# Patient Record
Sex: Male | Born: 1945 | Race: White | Hispanic: No | Marital: Married | Smoking: Former smoker
Health system: Southern US, Community
[De-identification: ages and names within clinical notes are randomized; demographics above are authoritative.]

## PROBLEM LIST (undated history)

## (undated) DIAGNOSIS — E785 Hyperlipidemia, unspecified: Secondary | ICD-10-CM

## (undated) DIAGNOSIS — R451 Restlessness and agitation: Secondary | ICD-10-CM

## (undated) DIAGNOSIS — I639 Cerebral infarction, unspecified: Secondary | ICD-10-CM

## (undated) DIAGNOSIS — C4359 Malignant melanoma of other part of trunk: Secondary | ICD-10-CM

## (undated) DIAGNOSIS — E46 Unspecified protein-calorie malnutrition: Secondary | ICD-10-CM

## (undated) HISTORY — DX: Malignant melanoma of other part of trunk: C43.59

## (undated) HISTORY — DX: Hyperlipidemia, unspecified: E78.5

## (undated) HISTORY — PX: CORNEAL TRANSPLANT: SHX108

## (undated) HISTORY — DX: Cerebral infarction, unspecified: I63.9

## (undated) HISTORY — PX: BACK SURGERY: SHX140

## (undated) HISTORY — DX: Unspecified protein-calorie malnutrition: E46

## (undated) HISTORY — PX: MELANOMA EXCISION: SHX5266

## (undated) HISTORY — DX: Restlessness and agitation: R45.1

---

## 1986-04-30 DIAGNOSIS — C4359 Malignant melanoma of other part of trunk: Secondary | ICD-10-CM

## 1986-04-30 HISTORY — DX: Malignant melanoma of other part of trunk: C43.59

## 2005-01-31 ENCOUNTER — Ambulatory Visit: Payer: Self-pay | Admitting: Physician Assistant

## 2005-02-08 ENCOUNTER — Encounter: Payer: Self-pay | Admitting: Unknown Physician Specialty

## 2005-05-03 ENCOUNTER — Inpatient Hospital Stay: Payer: Self-pay | Admitting: Internal Medicine

## 2005-05-03 ENCOUNTER — Other Ambulatory Visit: Payer: Self-pay

## 2005-05-25 ENCOUNTER — Ambulatory Visit: Payer: Self-pay | Admitting: Family Medicine

## 2005-05-31 ENCOUNTER — Ambulatory Visit: Payer: Self-pay | Admitting: Internal Medicine

## 2005-10-22 ENCOUNTER — Ambulatory Visit: Payer: Self-pay | Admitting: Gastroenterology

## 2005-10-30 ENCOUNTER — Ambulatory Visit: Payer: Self-pay | Admitting: Unknown Physician Specialty

## 2006-05-01 ENCOUNTER — Ambulatory Visit: Payer: Self-pay | Admitting: Internal Medicine

## 2007-01-02 ENCOUNTER — Ambulatory Visit: Payer: Self-pay | Admitting: Internal Medicine

## 2007-01-09 ENCOUNTER — Ambulatory Visit: Payer: Self-pay | Admitting: Internal Medicine

## 2007-04-10 ENCOUNTER — Ambulatory Visit: Payer: Self-pay | Admitting: Internal Medicine

## 2007-07-02 ENCOUNTER — Ambulatory Visit: Payer: Self-pay | Admitting: Internal Medicine

## 2007-07-07 ENCOUNTER — Ambulatory Visit: Payer: Self-pay | Admitting: Internal Medicine

## 2007-07-14 ENCOUNTER — Ambulatory Visit: Payer: Self-pay | Admitting: Internal Medicine

## 2007-09-11 ENCOUNTER — Encounter: Payer: Self-pay | Admitting: Neurosurgery

## 2007-09-29 ENCOUNTER — Encounter: Payer: Self-pay | Admitting: Neurosurgery

## 2009-06-12 ENCOUNTER — Emergency Department: Payer: Self-pay | Admitting: Emergency Medicine

## 2009-06-19 ENCOUNTER — Ambulatory Visit: Payer: Self-pay | Admitting: Internal Medicine

## 2011-01-09 ENCOUNTER — Ambulatory Visit: Payer: Self-pay | Admitting: Gastroenterology

## 2011-01-11 LAB — PATHOLOGY REPORT

## 2011-04-17 HISTORY — PX: OTHER SURGICAL HISTORY: SHX169

## 2011-06-18 ENCOUNTER — Emergency Department: Payer: Self-pay | Admitting: Emergency Medicine

## 2011-06-18 DIAGNOSIS — R5381 Other malaise: Secondary | ICD-10-CM | POA: Diagnosis not present

## 2011-06-18 DIAGNOSIS — F0280 Dementia in other diseases classified elsewhere without behavioral disturbance: Secondary | ICD-10-CM | POA: Diagnosis not present

## 2011-06-18 DIAGNOSIS — R29818 Other symptoms and signs involving the nervous system: Secondary | ICD-10-CM | POA: Diagnosis not present

## 2011-06-18 DIAGNOSIS — R4182 Altered mental status, unspecified: Secondary | ICD-10-CM | POA: Diagnosis not present

## 2011-06-18 DIAGNOSIS — R5383 Other fatigue: Secondary | ICD-10-CM | POA: Diagnosis not present

## 2011-06-18 DIAGNOSIS — Z79899 Other long term (current) drug therapy: Secondary | ICD-10-CM | POA: Diagnosis not present

## 2011-06-18 DIAGNOSIS — E785 Hyperlipidemia, unspecified: Secondary | ICD-10-CM | POA: Diagnosis not present

## 2011-06-18 DIAGNOSIS — R4789 Other speech disturbances: Secondary | ICD-10-CM | POA: Diagnosis not present

## 2011-06-18 LAB — CBC
HCT: 42.9 % (ref 40.0–52.0)
HGB: 14.6 g/dL (ref 13.0–18.0)
MCH: 30 pg (ref 26.0–34.0)
MCHC: 34 g/dL (ref 32.0–36.0)
MCV: 88 fL (ref 80–100)
RDW: 12.7 % (ref 11.5–14.5)
WBC: 7.7 10*3/uL (ref 3.8–10.6)

## 2011-06-18 LAB — URINALYSIS, COMPLETE
Bacteria: NONE SEEN
Blood: NEGATIVE
Glucose,UR: NEGATIVE mg/dL (ref 0–75)
Ketone: NEGATIVE
Leukocyte Esterase: NEGATIVE
Nitrite: NEGATIVE
Protein: NEGATIVE
RBC,UR: 1 /HPF (ref 0–5)
Specific Gravity: 1.021 (ref 1.003–1.030)
Squamous Epithelial: NONE SEEN
WBC UR: <1 /HPF (ref 0–5)

## 2011-06-18 LAB — COMPREHENSIVE METABOLIC PANEL
Albumin: 3.6 g/dL (ref 3.4–5.0)
Anion Gap: 8 (ref 7–16)
BUN: 17 mg/dL (ref 7–18)
Bilirubin,Total: 0.5 mg/dL (ref 0.2–1.0)
Chloride: 105 mmol/L (ref 98–107)
Co2: 28 mmol/L (ref 21–32)
Creatinine: 1.08 mg/dL (ref 0.60–1.30)
EGFR (Non-African Amer.): >60
Glucose: 122 mg/dL — ABNORMAL HIGH (ref 65–99)
Osmolality: 284 (ref 275–301)
Potassium: 4.3 mmol/L (ref 3.5–5.1)
Sodium: 141 mmol/L (ref 136–145)
Total Protein: 7.7 g/dL (ref 6.4–8.2)

## 2011-06-19 DIAGNOSIS — R269 Unspecified abnormalities of gait and mobility: Secondary | ICD-10-CM | POA: Diagnosis not present

## 2011-06-19 DIAGNOSIS — G3109 Other frontotemporal dementia: Secondary | ICD-10-CM | POA: Diagnosis not present

## 2011-06-19 DIAGNOSIS — F0281 Dementia in other diseases classified elsewhere with behavioral disturbance: Secondary | ICD-10-CM | POA: Diagnosis not present

## 2011-06-21 DIAGNOSIS — R269 Unspecified abnormalities of gait and mobility: Secondary | ICD-10-CM | POA: Diagnosis not present

## 2011-06-21 DIAGNOSIS — F0281 Dementia in other diseases classified elsewhere with behavioral disturbance: Secondary | ICD-10-CM | POA: Diagnosis not present

## 2011-06-21 DIAGNOSIS — G3109 Other frontotemporal dementia: Secondary | ICD-10-CM | POA: Diagnosis not present

## 2011-06-22 DIAGNOSIS — F0280 Dementia in other diseases classified elsewhere without behavioral disturbance: Secondary | ICD-10-CM | POA: Diagnosis not present

## 2011-06-22 DIAGNOSIS — R269 Unspecified abnormalities of gait and mobility: Secondary | ICD-10-CM | POA: Diagnosis not present

## 2011-06-22 DIAGNOSIS — F0281 Dementia in other diseases classified elsewhere with behavioral disturbance: Secondary | ICD-10-CM | POA: Diagnosis not present

## 2011-06-22 DIAGNOSIS — G3109 Other frontotemporal dementia: Secondary | ICD-10-CM | POA: Diagnosis not present

## 2011-06-23 DIAGNOSIS — F0280 Dementia in other diseases classified elsewhere without behavioral disturbance: Secondary | ICD-10-CM | POA: Diagnosis not present

## 2011-06-23 DIAGNOSIS — F0281 Dementia in other diseases classified elsewhere with behavioral disturbance: Secondary | ICD-10-CM | POA: Diagnosis not present

## 2011-06-23 DIAGNOSIS — R269 Unspecified abnormalities of gait and mobility: Secondary | ICD-10-CM | POA: Diagnosis not present

## 2011-06-26 DIAGNOSIS — Z87820 Personal history of traumatic brain injury: Secondary | ICD-10-CM | POA: Diagnosis not present

## 2011-06-26 DIAGNOSIS — F0281 Dementia in other diseases classified elsewhere with behavioral disturbance: Secondary | ICD-10-CM | POA: Diagnosis not present

## 2011-06-26 DIAGNOSIS — G911 Obstructive hydrocephalus: Secondary | ICD-10-CM | POA: Diagnosis not present

## 2011-06-26 DIAGNOSIS — R453 Demoralization and apathy: Secondary | ICD-10-CM | POA: Diagnosis not present

## 2011-06-26 DIAGNOSIS — F09 Unspecified mental disorder due to known physiological condition: Secondary | ICD-10-CM | POA: Diagnosis not present

## 2011-06-26 DIAGNOSIS — F0391 Unspecified dementia with behavioral disturbance: Secondary | ICD-10-CM | POA: Diagnosis not present

## 2011-06-26 DIAGNOSIS — IMO0002 Reserved for concepts with insufficient information to code with codable children: Secondary | ICD-10-CM | POA: Diagnosis not present

## 2011-06-26 DIAGNOSIS — G3109 Other frontotemporal dementia: Secondary | ICD-10-CM | POA: Diagnosis not present

## 2011-06-26 DIAGNOSIS — R269 Unspecified abnormalities of gait and mobility: Secondary | ICD-10-CM | POA: Diagnosis not present

## 2011-06-28 DIAGNOSIS — F0280 Dementia in other diseases classified elsewhere without behavioral disturbance: Secondary | ICD-10-CM | POA: Diagnosis not present

## 2011-06-29 DIAGNOSIS — F0280 Dementia in other diseases classified elsewhere without behavioral disturbance: Secondary | ICD-10-CM | POA: Diagnosis not present

## 2011-06-29 DIAGNOSIS — F0281 Dementia in other diseases classified elsewhere with behavioral disturbance: Secondary | ICD-10-CM | POA: Diagnosis not present

## 2011-06-29 DIAGNOSIS — R269 Unspecified abnormalities of gait and mobility: Secondary | ICD-10-CM | POA: Diagnosis not present

## 2011-07-01 DIAGNOSIS — G3109 Other frontotemporal dementia: Secondary | ICD-10-CM | POA: Diagnosis not present

## 2011-07-01 DIAGNOSIS — R269 Unspecified abnormalities of gait and mobility: Secondary | ICD-10-CM | POA: Diagnosis not present

## 2011-07-01 DIAGNOSIS — F0281 Dementia in other diseases classified elsewhere with behavioral disturbance: Secondary | ICD-10-CM | POA: Diagnosis not present

## 2011-07-02 DIAGNOSIS — F0281 Dementia in other diseases classified elsewhere with behavioral disturbance: Secondary | ICD-10-CM | POA: Diagnosis not present

## 2011-07-02 DIAGNOSIS — G3109 Other frontotemporal dementia: Secondary | ICD-10-CM | POA: Diagnosis not present

## 2011-07-02 DIAGNOSIS — R269 Unspecified abnormalities of gait and mobility: Secondary | ICD-10-CM | POA: Diagnosis not present

## 2011-07-03 DIAGNOSIS — G3109 Other frontotemporal dementia: Secondary | ICD-10-CM | POA: Diagnosis not present

## 2011-07-03 DIAGNOSIS — F0281 Dementia in other diseases classified elsewhere with behavioral disturbance: Secondary | ICD-10-CM | POA: Diagnosis not present

## 2011-07-03 DIAGNOSIS — R269 Unspecified abnormalities of gait and mobility: Secondary | ICD-10-CM | POA: Diagnosis not present

## 2011-07-04 DIAGNOSIS — F0280 Dementia in other diseases classified elsewhere without behavioral disturbance: Secondary | ICD-10-CM | POA: Diagnosis not present

## 2011-07-04 DIAGNOSIS — F0281 Dementia in other diseases classified elsewhere with behavioral disturbance: Secondary | ICD-10-CM | POA: Diagnosis not present

## 2011-07-04 DIAGNOSIS — R269 Unspecified abnormalities of gait and mobility: Secondary | ICD-10-CM | POA: Diagnosis not present

## 2011-07-05 ENCOUNTER — Emergency Department: Payer: Self-pay | Admitting: Internal Medicine

## 2011-07-05 DIAGNOSIS — R0602 Shortness of breath: Secondary | ICD-10-CM | POA: Diagnosis not present

## 2011-07-05 DIAGNOSIS — G911 Obstructive hydrocephalus: Secondary | ICD-10-CM | POA: Diagnosis not present

## 2011-07-05 DIAGNOSIS — E7800 Pure hypercholesterolemia, unspecified: Secondary | ICD-10-CM | POA: Diagnosis not present

## 2011-07-05 DIAGNOSIS — Z79899 Other long term (current) drug therapy: Secondary | ICD-10-CM | POA: Diagnosis not present

## 2011-07-05 DIAGNOSIS — R5383 Other fatigue: Secondary | ICD-10-CM | POA: Diagnosis not present

## 2011-07-05 DIAGNOSIS — R5381 Other malaise: Secondary | ICD-10-CM | POA: Diagnosis not present

## 2011-07-05 DIAGNOSIS — F29 Unspecified psychosis not due to a substance or known physiological condition: Secondary | ICD-10-CM | POA: Diagnosis not present

## 2011-07-05 DIAGNOSIS — F0390 Unspecified dementia without behavioral disturbance: Secondary | ICD-10-CM | POA: Diagnosis not present

## 2011-07-05 DIAGNOSIS — F0280 Dementia in other diseases classified elsewhere without behavioral disturbance: Secondary | ICD-10-CM | POA: Diagnosis not present

## 2011-07-05 LAB — CBC
HCT: 45 % (ref 40.0–52.0)
MCV: 88 fL (ref 80–100)
RBC: 5.12 10*6/uL (ref 4.40–5.90)
RDW: 12.1 % (ref 11.5–14.5)
WBC: 7.3 10*3/uL (ref 3.8–10.6)

## 2011-07-05 LAB — COMPREHENSIVE METABOLIC PANEL
Albumin: 3.8 g/dL (ref 3.4–5.0)
Anion Gap: 11 (ref 7–16)
BUN: 22 mg/dL — ABNORMAL HIGH (ref 7–18)
Chloride: 106 mmol/L (ref 98–107)
Creatinine: 1.31 mg/dL — ABNORMAL HIGH (ref 0.60–1.30)
EGFR (African American): >60
EGFR (Non-African Amer.): 58 — ABNORMAL LOW
Glucose: 83 mg/dL (ref 65–99)
Osmolality: 289 (ref 275–301)
SGOT(AST): 36 U/L (ref 15–37)
SGPT (ALT): 28 U/L
Sodium: 144 mmol/L (ref 136–145)

## 2011-07-05 LAB — CK TOTAL AND CKMB (NOT AT ARMC)
CK, Total: 121 U/L (ref 35–232)
CK-MB: 1.3 ng/mL (ref 0.5–3.6)

## 2011-07-05 LAB — PROTIME-INR: Prothrombin Time: 13.6 secs (ref 11.5–14.7)

## 2011-07-06 DIAGNOSIS — Z79899 Other long term (current) drug therapy: Secondary | ICD-10-CM | POA: Diagnosis not present

## 2011-07-06 DIAGNOSIS — R269 Unspecified abnormalities of gait and mobility: Secondary | ICD-10-CM | POA: Diagnosis not present

## 2011-07-06 DIAGNOSIS — M25519 Pain in unspecified shoulder: Secondary | ICD-10-CM | POA: Diagnosis not present

## 2011-07-06 DIAGNOSIS — Z9119 Patient's noncompliance with other medical treatment and regimen: Secondary | ICD-10-CM | POA: Diagnosis not present

## 2011-07-06 DIAGNOSIS — R404 Transient alteration of awareness: Secondary | ICD-10-CM | POA: Diagnosis not present

## 2011-07-06 DIAGNOSIS — Z5189 Encounter for other specified aftercare: Secondary | ICD-10-CM | POA: Diagnosis not present

## 2011-07-06 DIAGNOSIS — IMO0002 Reserved for concepts with insufficient information to code with codable children: Secondary | ICD-10-CM | POA: Diagnosis present

## 2011-07-06 DIAGNOSIS — K5900 Constipation, unspecified: Secondary | ICD-10-CM | POA: Diagnosis not present

## 2011-07-06 DIAGNOSIS — F411 Generalized anxiety disorder: Secondary | ICD-10-CM | POA: Diagnosis present

## 2011-07-06 DIAGNOSIS — F302 Manic episode, severe with psychotic symptoms: Secondary | ICD-10-CM | POA: Diagnosis present

## 2011-07-06 DIAGNOSIS — Z885 Allergy status to narcotic agent status: Secondary | ICD-10-CM | POA: Diagnosis not present

## 2011-07-06 DIAGNOSIS — M6281 Muscle weakness (generalized): Secondary | ICD-10-CM | POA: Diagnosis not present

## 2011-07-06 DIAGNOSIS — M25559 Pain in unspecified hip: Secondary | ICD-10-CM | POA: Diagnosis not present

## 2011-07-06 DIAGNOSIS — F29 Unspecified psychosis not due to a substance or known physiological condition: Secondary | ICD-10-CM | POA: Diagnosis not present

## 2011-07-06 DIAGNOSIS — G911 Obstructive hydrocephalus: Secondary | ICD-10-CM | POA: Diagnosis present

## 2011-07-06 DIAGNOSIS — F482 Pseudobulbar affect: Secondary | ICD-10-CM | POA: Diagnosis not present

## 2011-07-06 DIAGNOSIS — M47817 Spondylosis without myelopathy or radiculopathy, lumbosacral region: Secondary | ICD-10-CM | POA: Diagnosis not present

## 2011-07-06 DIAGNOSIS — F02818 Dementia in other diseases classified elsewhere, unspecified severity, with other behavioral disturbance: Secondary | ICD-10-CM | POA: Diagnosis not present

## 2011-07-06 DIAGNOSIS — R7989 Other specified abnormal findings of blood chemistry: Secondary | ICD-10-CM | POA: Diagnosis not present

## 2011-07-06 DIAGNOSIS — F0391 Unspecified dementia with behavioral disturbance: Secondary | ICD-10-CM | POA: Diagnosis not present

## 2011-07-06 DIAGNOSIS — F0390 Unspecified dementia without behavioral disturbance: Secondary | ICD-10-CM | POA: Diagnosis not present

## 2011-07-06 DIAGNOSIS — M259 Joint disorder, unspecified: Secondary | ICD-10-CM | POA: Diagnosis not present

## 2011-07-06 DIAGNOSIS — Z8673 Personal history of transient ischemic attack (TIA), and cerebral infarction without residual deficits: Secondary | ICD-10-CM | POA: Diagnosis not present

## 2011-07-06 DIAGNOSIS — F0280 Dementia in other diseases classified elsewhere without behavioral disturbance: Secondary | ICD-10-CM | POA: Diagnosis not present

## 2011-07-06 DIAGNOSIS — F603 Borderline personality disorder: Secondary | ICD-10-CM | POA: Diagnosis present

## 2011-07-06 DIAGNOSIS — L259 Unspecified contact dermatitis, unspecified cause: Secondary | ICD-10-CM | POA: Diagnosis present

## 2011-07-06 DIAGNOSIS — R350 Frequency of micturition: Secondary | ICD-10-CM | POA: Diagnosis not present

## 2011-07-06 DIAGNOSIS — F315 Bipolar disorder, current episode depressed, severe, with psychotic features: Secondary | ICD-10-CM | POA: Diagnosis present

## 2011-07-06 DIAGNOSIS — F0281 Dementia in other diseases classified elsewhere with behavioral disturbance: Secondary | ICD-10-CM | POA: Diagnosis present

## 2011-07-06 DIAGNOSIS — E7800 Pure hypercholesterolemia, unspecified: Secondary | ICD-10-CM | POA: Diagnosis present

## 2011-07-06 DIAGNOSIS — M79609 Pain in unspecified limb: Secondary | ICD-10-CM | POA: Diagnosis not present

## 2011-07-06 DIAGNOSIS — R1312 Dysphagia, oropharyngeal phase: Secondary | ICD-10-CM | POA: Diagnosis not present

## 2011-07-06 DIAGNOSIS — R5381 Other malaise: Secondary | ICD-10-CM | POA: Diagnosis present

## 2011-07-16 DIAGNOSIS — G3109 Other frontotemporal dementia: Secondary | ICD-10-CM | POA: Diagnosis not present

## 2011-07-16 DIAGNOSIS — R269 Unspecified abnormalities of gait and mobility: Secondary | ICD-10-CM | POA: Diagnosis not present

## 2011-07-16 DIAGNOSIS — F0281 Dementia in other diseases classified elsewhere with behavioral disturbance: Secondary | ICD-10-CM | POA: Diagnosis not present

## 2011-08-13 ENCOUNTER — Encounter: Payer: Self-pay | Admitting: Internal Medicine

## 2011-08-13 DIAGNOSIS — R6889 Other general symptoms and signs: Secondary | ICD-10-CM | POA: Diagnosis not present

## 2011-08-13 DIAGNOSIS — M6281 Muscle weakness (generalized): Secondary | ICD-10-CM | POA: Diagnosis not present

## 2011-08-13 DIAGNOSIS — I679 Cerebrovascular disease, unspecified: Secondary | ICD-10-CM | POA: Diagnosis not present

## 2011-08-13 DIAGNOSIS — F0280 Dementia in other diseases classified elsewhere without behavioral disturbance: Secondary | ICD-10-CM | POA: Diagnosis not present

## 2011-08-13 DIAGNOSIS — G4700 Insomnia, unspecified: Secondary | ICD-10-CM | POA: Diagnosis not present

## 2011-08-13 DIAGNOSIS — Z5189 Encounter for other specified aftercare: Secondary | ICD-10-CM | POA: Diagnosis not present

## 2011-08-13 DIAGNOSIS — E7800 Pure hypercholesterolemia, unspecified: Secondary | ICD-10-CM | POA: Diagnosis not present

## 2011-08-13 DIAGNOSIS — F0391 Unspecified dementia with behavioral disturbance: Secondary | ICD-10-CM | POA: Diagnosis not present

## 2011-08-13 DIAGNOSIS — K5900 Constipation, unspecified: Secondary | ICD-10-CM | POA: Diagnosis not present

## 2011-08-13 DIAGNOSIS — R634 Abnormal weight loss: Secondary | ICD-10-CM | POA: Diagnosis not present

## 2011-08-13 DIAGNOSIS — F329 Major depressive disorder, single episode, unspecified: Secondary | ICD-10-CM | POA: Diagnosis not present

## 2011-08-13 DIAGNOSIS — F0390 Unspecified dementia without behavioral disturbance: Secondary | ICD-10-CM | POA: Diagnosis not present

## 2011-08-13 DIAGNOSIS — G9340 Encephalopathy, unspecified: Secondary | ICD-10-CM | POA: Diagnosis not present

## 2011-08-13 DIAGNOSIS — Z8673 Personal history of transient ischemic attack (TIA), and cerebral infarction without residual deficits: Secondary | ICD-10-CM | POA: Diagnosis not present

## 2011-08-13 DIAGNOSIS — F482 Pseudobulbar affect: Secondary | ICD-10-CM | POA: Diagnosis not present

## 2011-08-13 DIAGNOSIS — I6990 Unspecified sequelae of unspecified cerebrovascular disease: Secondary | ICD-10-CM | POA: Diagnosis not present

## 2011-08-13 DIAGNOSIS — F411 Generalized anxiety disorder: Secondary | ICD-10-CM | POA: Diagnosis not present

## 2011-08-13 DIAGNOSIS — R1312 Dysphagia, oropharyngeal phase: Secondary | ICD-10-CM | POA: Diagnosis not present

## 2011-08-13 DIAGNOSIS — F39 Unspecified mood [affective] disorder: Secondary | ICD-10-CM | POA: Diagnosis not present

## 2011-08-13 DIAGNOSIS — R350 Frequency of micturition: Secondary | ICD-10-CM | POA: Diagnosis not present

## 2011-08-13 DIAGNOSIS — F29 Unspecified psychosis not due to a substance or known physiological condition: Secondary | ICD-10-CM | POA: Diagnosis not present

## 2011-08-14 DIAGNOSIS — G9340 Encephalopathy, unspecified: Secondary | ICD-10-CM | POA: Diagnosis not present

## 2011-08-14 DIAGNOSIS — R634 Abnormal weight loss: Secondary | ICD-10-CM | POA: Diagnosis not present

## 2011-08-15 DIAGNOSIS — F411 Generalized anxiety disorder: Secondary | ICD-10-CM | POA: Diagnosis not present

## 2011-08-15 DIAGNOSIS — F0391 Unspecified dementia with behavioral disturbance: Secondary | ICD-10-CM | POA: Diagnosis not present

## 2011-08-15 DIAGNOSIS — F482 Pseudobulbar affect: Secondary | ICD-10-CM | POA: Diagnosis not present

## 2011-08-15 DIAGNOSIS — F329 Major depressive disorder, single episode, unspecified: Secondary | ICD-10-CM | POA: Diagnosis not present

## 2011-08-15 DIAGNOSIS — F39 Unspecified mood [affective] disorder: Secondary | ICD-10-CM | POA: Diagnosis not present

## 2011-08-15 DIAGNOSIS — G4700 Insomnia, unspecified: Secondary | ICD-10-CM | POA: Diagnosis not present

## 2011-08-23 DIAGNOSIS — F411 Generalized anxiety disorder: Secondary | ICD-10-CM | POA: Diagnosis not present

## 2011-08-23 DIAGNOSIS — F0391 Unspecified dementia with behavioral disturbance: Secondary | ICD-10-CM | POA: Diagnosis not present

## 2011-08-23 DIAGNOSIS — G4700 Insomnia, unspecified: Secondary | ICD-10-CM | POA: Diagnosis not present

## 2011-08-23 DIAGNOSIS — F482 Pseudobulbar affect: Secondary | ICD-10-CM | POA: Diagnosis not present

## 2011-08-23 DIAGNOSIS — F39 Unspecified mood [affective] disorder: Secondary | ICD-10-CM | POA: Diagnosis not present

## 2011-08-23 DIAGNOSIS — F329 Major depressive disorder, single episode, unspecified: Secondary | ICD-10-CM | POA: Diagnosis not present

## 2011-08-27 DIAGNOSIS — K5900 Constipation, unspecified: Secondary | ICD-10-CM | POA: Diagnosis not present

## 2011-08-27 DIAGNOSIS — R350 Frequency of micturition: Secondary | ICD-10-CM | POA: Diagnosis not present

## 2011-08-27 LAB — URINALYSIS, COMPLETE
Bacteria: NONE SEEN
Bilirubin,UR: NEGATIVE
Blood: NEGATIVE
Glucose,UR: NEGATIVE mg/dL (ref 0–75)
Ketone: NEGATIVE
Ph: 6 (ref 4.5–8.0)
Protein: NEGATIVE
RBC,UR: 1 /HPF (ref 0–5)
Specific Gravity: 1.015 (ref 1.003–1.030)

## 2011-08-28 LAB — URINE CULTURE

## 2011-08-29 ENCOUNTER — Encounter: Payer: Self-pay | Admitting: Internal Medicine

## 2011-09-12 ENCOUNTER — Encounter: Payer: Self-pay | Admitting: Internal Medicine

## 2011-09-12 DIAGNOSIS — F0280 Dementia in other diseases classified elsewhere without behavioral disturbance: Secondary | ICD-10-CM | POA: Insufficient documentation

## 2011-09-12 DIAGNOSIS — R532 Functional quadriplegia: Secondary | ICD-10-CM | POA: Insufficient documentation

## 2011-09-12 DIAGNOSIS — E785 Hyperlipidemia, unspecified: Secondary | ICD-10-CM | POA: Insufficient documentation

## 2011-09-12 DIAGNOSIS — E46 Unspecified protein-calorie malnutrition: Secondary | ICD-10-CM | POA: Insufficient documentation

## 2011-09-13 DIAGNOSIS — F0280 Dementia in other diseases classified elsewhere without behavioral disturbance: Secondary | ICD-10-CM

## 2011-09-13 DIAGNOSIS — I679 Cerebrovascular disease, unspecified: Secondary | ICD-10-CM

## 2011-09-13 DIAGNOSIS — R410 Disorientation, unspecified: Secondary | ICD-10-CM

## 2011-09-13 DIAGNOSIS — I6990 Unspecified sequelae of unspecified cerebrovascular disease: Secondary | ICD-10-CM

## 2011-09-13 DIAGNOSIS — G3109 Other frontotemporal dementia: Secondary | ICD-10-CM

## 2011-09-13 DIAGNOSIS — F0151 Vascular dementia with behavioral disturbance: Secondary | ICD-10-CM

## 2011-09-19 ENCOUNTER — Encounter: Payer: Self-pay | Admitting: Internal Medicine

## 2011-09-19 ENCOUNTER — Ambulatory Visit (INDEPENDENT_AMBULATORY_CARE_PROVIDER_SITE_OTHER): Payer: Self-pay | Admitting: Internal Medicine

## 2011-09-19 VITALS — BP 122/68 | HR 72 | Resp 12

## 2011-09-19 DIAGNOSIS — R451 Restlessness and agitation: Secondary | ICD-10-CM

## 2011-09-19 DIAGNOSIS — I639 Cerebral infarction, unspecified: Secondary | ICD-10-CM

## 2011-09-19 DIAGNOSIS — E785 Hyperlipidemia, unspecified: Secondary | ICD-10-CM

## 2011-09-19 DIAGNOSIS — I6350 Cerebral infarction due to unspecified occlusion or stenosis of unspecified cerebral artery: Secondary | ICD-10-CM

## 2011-09-19 DIAGNOSIS — G3109 Other frontotemporal dementia: Secondary | ICD-10-CM

## 2011-09-19 DIAGNOSIS — F39 Unspecified mood [affective] disorder: Secondary | ICD-10-CM | POA: Insufficient documentation

## 2011-09-19 DIAGNOSIS — C4359 Malignant melanoma of other part of trunk: Secondary | ICD-10-CM | POA: Insufficient documentation

## 2011-09-19 DIAGNOSIS — R066 Hiccough: Secondary | ICD-10-CM

## 2011-09-19 DIAGNOSIS — IMO0002 Reserved for concepts with insufficient information to code with codable children: Secondary | ICD-10-CM

## 2011-09-19 DIAGNOSIS — E46 Unspecified protein-calorie malnutrition: Secondary | ICD-10-CM

## 2011-09-19 NOTE — Assessment & Plan Note (Signed)
Better now Will increase the namenda Stop nuedexta ---no evidence that he has pseudobulbar affect Can consider lorazepam if needs prn

## 2011-09-19 NOTE — Assessment & Plan Note (Signed)
Not confirmed May have been involved in crisis in March with sig worsening Discussed secondary prevention with aspirin---I am not excited about this

## 2011-09-19 NOTE — Assessment & Plan Note (Addendum)
Diagnosis in doubt but with early labile affect, seems appropriate May have decompensated off the donepezil so will continue Increase the namenda back to bid Total care with 24 hour aides and hospice DNR rewritten in my name  Counseled 20 minutes after hour visit about hospice eligibility and SNF placement options

## 2011-09-19 NOTE — Assessment & Plan Note (Signed)
Rx is not appropriate

## 2011-09-19 NOTE — Assessment & Plan Note (Signed)
This has resolved Eating well and has gained back weight
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
## 2011-09-19 NOTE — Assessment & Plan Note (Signed)
Okay to try off the PPI to see if he still needs it

## 2011-09-29 DIAGNOSIS — I6990 Unspecified sequelae of unspecified cerebrovascular disease: Secondary | ICD-10-CM | POA: Diagnosis not present

## 2011-09-29 DIAGNOSIS — I679 Cerebrovascular disease, unspecified: Secondary | ICD-10-CM | POA: Diagnosis not present

## 2011-09-29 DIAGNOSIS — F0280 Dementia in other diseases classified elsewhere without behavioral disturbance: Secondary | ICD-10-CM | POA: Diagnosis not present

## 2011-09-29 DIAGNOSIS — F05 Delirium due to known physiological condition: Secondary | ICD-10-CM | POA: Diagnosis not present

## 2011-10-02 ENCOUNTER — Other Ambulatory Visit: Payer: Self-pay | Admitting: *Deleted

## 2011-10-02 MED ORDER — DONEPEZIL HCL 5 MG PO TABS: 5.0000 mg | ORAL_TABLET | Freq: Every day | ORAL | Status: DC

## 2011-10-22 ENCOUNTER — Other Ambulatory Visit: Payer: Self-pay | Admitting: *Deleted

## 2011-10-22 MED ORDER — MEMANTINE HCL 10 MG PO TABS: 10.0000 mg | ORAL_TABLET | Freq: Two times a day (BID) | ORAL | Status: DC

## 2011-10-25 ENCOUNTER — Other Ambulatory Visit: Payer: Self-pay | Admitting: *Deleted

## 2011-10-25 NOTE — Telephone Encounter (Signed)
Fax request states pt was discharge from [facility] place, please advise on refills

## 2011-10-26 MED ORDER — TRAZODONE HCL 50 MG PO TABS: 50.0000 mg | ORAL_TABLET | Freq: Every day | ORAL | Status: DC

## 2011-10-26 MED ORDER — DEXTROMETHORPHAN-QUINIDINE 20-10 MG PO CAPS: 1.0000 | ORAL_CAPSULE | Freq: Two times a day (BID) | ORAL | Status: DC

## 2011-10-26 NOTE — Telephone Encounter (Signed)
Okay trazodone for a year  nuedexta for 6 months

## 2011-10-26 NOTE — Telephone Encounter (Signed)
rx sent to pharmacy by e-script  

## 2011-10-29 DIAGNOSIS — F0280 Dementia in other diseases classified elsewhere without behavioral disturbance: Secondary | ICD-10-CM | POA: Diagnosis not present

## 2011-10-29 DIAGNOSIS — I6990 Unspecified sequelae of unspecified cerebrovascular disease: Secondary | ICD-10-CM | POA: Diagnosis not present

## 2011-10-29 DIAGNOSIS — F0150 Vascular dementia without behavioral disturbance: Secondary | ICD-10-CM | POA: Diagnosis not present

## 2011-10-29 DIAGNOSIS — I679 Cerebrovascular disease, unspecified: Secondary | ICD-10-CM | POA: Diagnosis not present

## 2011-11-13 ENCOUNTER — Telehealth: Payer: Self-pay | Admitting: Internal Medicine
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
## 2011-11-20 ENCOUNTER — Inpatient Hospital Stay: Payer: Self-pay | Admitting: Internal Medicine

## 2011-11-20 DIAGNOSIS — R6889 Other general symptoms and signs: Secondary | ICD-10-CM | POA: Diagnosis not present

## 2011-11-20 DIAGNOSIS — F0390 Unspecified dementia without behavioral disturbance: Secondary | ICD-10-CM | POA: Diagnosis not present

## 2011-11-20 DIAGNOSIS — R509 Fever, unspecified: Secondary | ICD-10-CM | POA: Diagnosis not present

## 2011-11-20 DIAGNOSIS — I82409 Acute embolism and thrombosis of unspecified deep veins of unspecified lower extremity: Secondary | ICD-10-CM | POA: Diagnosis not present

## 2011-11-20 DIAGNOSIS — M25519 Pain in unspecified shoulder: Secondary | ICD-10-CM | POA: Diagnosis not present

## 2011-11-20 DIAGNOSIS — I82619 Acute embolism and thrombosis of superficial veins of unspecified upper extremity: Secondary | ICD-10-CM | POA: Diagnosis not present

## 2011-11-20 LAB — URINALYSIS, COMPLETE
Bilirubin,UR: NEGATIVE
Blood: NEGATIVE
Nitrite: NEGATIVE
Ph: 6 (ref 4.5–8.0)
Protein: NEGATIVE
RBC,UR: 1 /HPF (ref 0–5)
Specific Gravity: 1.01 (ref 1.003–1.030)

## 2011-11-20 LAB — CBC WITH DIFFERENTIAL/PLATELET
Basophil #: 0 10*3/uL (ref 0.0–0.1)
Eosinophil #: 0.2 10*3/uL (ref 0.0–0.7)
Eosinophil %: 2.1 %
HCT: 42 % (ref 40.0–52.0)
HGB: 13.9 g/dL (ref 13.0–18.0)
Lymphocyte #: 2 10*3/uL (ref 1.0–3.6)
Lymphocyte %: 21.6 %
MCHC: 33 g/dL (ref 32.0–36.0)
Neutrophil #: 6.2 10*3/uL (ref 1.4–6.5)
Neutrophil %: 65.7 %
Platelet: 234 10*3/uL (ref 150–440)
RDW: 14 % (ref 11.5–14.5)

## 2011-11-20 LAB — PROTIME-INR: Prothrombin Time: 13.5 secs (ref 11.5–14.7)

## 2011-11-20 LAB — COMPREHENSIVE METABOLIC PANEL
Bilirubin,Total: 0.3 mg/dL (ref 0.2–1.0)
Chloride: 103 mmol/L (ref 98–107)
Co2: 25 mmol/L (ref 21–32)
Creatinine: 0.93 mg/dL (ref 0.60–1.30)
EGFR (African American): >60
EGFR (Non-African Amer.): >60
Osmolality: 275 (ref 275–301)
SGOT(AST): 17 U/L (ref 15–37)
SGPT (ALT): 18 U/L
Sodium: 138 mmol/L (ref 136–145)

## 2011-11-21 DIAGNOSIS — R6889 Other general symptoms and signs: Secondary | ICD-10-CM | POA: Diagnosis not present

## 2011-11-22 ENCOUNTER — Telehealth: Payer: Self-pay

## 2011-11-22 NOTE — Telephone Encounter (Signed)
Noted  

## 2011-11-22 NOTE — Telephone Encounter (Signed)
[name] Severe in micro at [facility] called report for positive blood culture in bottle from set of 4 bottles done in ER. Dot said possible contaminant. Dx left arm swelling. Should work up positive blood culture bottle; pts wbc 9.4 and other 3 bottles negative. Please advise. Dr Dayton Martes has not seen pt. Advised to either re draw blood culture or work up positive blood culture. Dot said pt not there so cannot redraw will work up positive blood culture.Tanner Rosario

## 2011-11-24 LAB — PROTIME-INR

## 2011-11-25 NOTE — Telephone Encounter (Signed)
Please check and see how he is doing and if we have a final report on the blood culture

## 2011-11-26 ENCOUNTER — Telehealth: Payer: Self-pay | Admitting: *Deleted

## 2011-11-26 LAB — CULTURE, BLOOD (SINGLE)

## 2011-11-26 NOTE — Telephone Encounter (Signed)
Called and spoke with nurse aide, and she states pt is doing fine as far as she know, she was off all weekend and pt and his wife where resting. [facility] and got copy of final blood culture, on your desk.

## 2011-11-26 NOTE — Telephone Encounter (Signed)
Calling hospice nurse because we received PT/INR 1.5 and per Dr.Letvak if pt on 4 mg daily increase to 5 mg and recheck pro time 8/1 and also to continue Lovenox 100 mg bid until 8/1.  Spoke with Ellan Lambert, hospice nurse and pt's coumadin increased to 5 mg. She did state that pt has had 4 days of Lovenox, should he still continue?

## 2011-11-26 NOTE — Telephone Encounter (Signed)
Notified Olegario Messier at hospice to continue injections and repeat protime on 11/29/11. Called in refill of lovenox 100 mg BID (6 syringes) no refills to [place].

## 2011-11-26 NOTE — Telephone Encounter (Signed)
Looks like a contaminant Just Staph epi

## 2011-11-26 NOTE — Telephone Encounter (Signed)
Yes--continue the lovenox pending the repeat protime on August 1

## 2011-11-29 ENCOUNTER — Telehealth: Payer: Self-pay | Admitting: Internal Medicine

## 2011-11-29 DIAGNOSIS — I679 Cerebrovascular disease, unspecified: Secondary | ICD-10-CM | POA: Diagnosis not present

## 2011-11-29 DIAGNOSIS — F0280 Dementia in other diseases classified elsewhere without behavioral disturbance: Secondary | ICD-10-CM | POA: Diagnosis not present

## 2011-11-29 DIAGNOSIS — F05 Delirium due to known physiological condition: Secondary | ICD-10-CM | POA: Diagnosis not present

## 2011-11-29 DIAGNOSIS — I6990 Unspecified sequelae of unspecified cerebrovascular disease: Secondary | ICD-10-CM | POA: Diagnosis not present

## 2011-11-29 NOTE — Telephone Encounter (Signed)
Spoke to Sue Lush Will stop the lovenox Continue same dose of coumadin Recheck on Monday the 5th---will adjust if still over 3.5 at that point  Called wife Confirmed appt next week 8/6 at about 5PM

## 2011-11-29 NOTE — Telephone Encounter (Signed)
Caller: Andrea/Other; PCP: Tillman Abide; CB#: [phone]. Call regarding Labs From Hospice Nurse. Caller has labs drawn on this gentleman this am. She asked that this Triage RN take results rather than wait any longer for office staff. INR 3.7 and PT is 37.5. He gets his last dose of Lovenox in the am and is still on 5mg  of Coumadin daily. She can be reached at [phone] for orders.

## 2011-12-04 ENCOUNTER — Telehealth: Payer: Self-pay

## 2011-12-04 NOTE — Telephone Encounter (Signed)
Spoke with hospice nurse and advised results  

## 2011-12-04 NOTE — Telephone Encounter (Signed)
Continue current coumadin dose. Recheck PT/INR in 1 week.

## 2011-12-04 NOTE — Telephone Encounter (Signed)
Tanner Rosario with hospice left v/m PT/INR 21.7 and 2.2  Pt presently taking Coumadin 5 mg daily.Please advise any dose change and when to recheck PT/INR.

## 2011-12-06 ENCOUNTER — Encounter: Payer: Self-pay | Admitting: Internal Medicine

## 2011-12-07 DIAGNOSIS — G3109 Other frontotemporal dementia: Secondary | ICD-10-CM | POA: Diagnosis not present

## 2011-12-07 DIAGNOSIS — I679 Cerebrovascular disease, unspecified: Secondary | ICD-10-CM

## 2011-12-07 DIAGNOSIS — F0151 Vascular dementia with behavioral disturbance: Secondary | ICD-10-CM

## 2011-12-07 DIAGNOSIS — R410 Disorientation, unspecified: Secondary | ICD-10-CM

## 2011-12-07 DIAGNOSIS — I6990 Unspecified sequelae of unspecified cerebrovascular disease: Secondary | ICD-10-CM

## 2011-12-07 DIAGNOSIS — F0280 Dementia in other diseases classified elsewhere without behavioral disturbance: Secondary | ICD-10-CM | POA: Diagnosis not present

## 2011-12-11 ENCOUNTER — Ambulatory Visit: Payer: Medicare Other | Admitting: Internal Medicine

## 2011-12-11 ENCOUNTER — Encounter: Payer: Self-pay | Admitting: Internal Medicine

## 2011-12-11 VITALS — BP 111/59 | HR 58 | Resp 16

## 2011-12-11 DIAGNOSIS — G3109 Other frontotemporal dementia: Secondary | ICD-10-CM

## 2011-12-11 DIAGNOSIS — IMO0002 Reserved for concepts with insufficient information to code with codable children: Secondary | ICD-10-CM

## 2011-12-11 DIAGNOSIS — I82629 Acute embolism and thrombosis of deep veins of unspecified upper extremity: Secondary | ICD-10-CM | POA: Diagnosis not present

## 2011-12-11 DIAGNOSIS — R451 Restlessness and agitation: Secondary | ICD-10-CM

## 2011-12-11 DIAGNOSIS — E46 Unspecified protein-calorie malnutrition: Secondary | ICD-10-CM | POA: Diagnosis not present

## 2011-12-11 DIAGNOSIS — F0280 Dementia in other diseases classified elsewhere without behavioral disturbance: Secondary | ICD-10-CM

## 2011-12-11 NOTE — Assessment & Plan Note (Signed)
Severe Total care Some speech but mostly about school bus lines, etc On hospice care

## 2011-12-11 NOTE — Progress Notes (Signed)
Subjective:    Patient ID: Tanner Rosario, male    DOB: 10/15/1945, 66 y.o.   MRN: [id number]  HPI Follow up home visit Wife is here Rosaline caregiver, Olegario Messier hospice RN Daughter Arline Asp is here  After last visit--became very agitated within 2 weeks This was restarted Continues to be agitated but some better Mostly the agitation is during personal care Spitting at times  Bed bound now--out of bed twice a day in chair with the lift Totally incontinent Occ he will feed himself---but often needs to be completely fed Down to 2 meals per day--too hard. Isn't even drinking much now  3 weeks ago arm swelling developed after fever Hospitalized and DVT diagnosed. Treated for apparent UTI as well Has been on coumadin since then  He is talking more now Seems more acclimated to stability here  Current Outpatient Prescriptions on File Prior to Visit  Medication Sig Dispense Refill  . cholecalciferol (VITAMIN D) 1000 UNITS tablet Take 1,000 Units by mouth daily.      Marland Kitchen Dextromethorphan-Quinidine (NUEDEXTA) 20-10 MG CAPS Take 1 capsule by mouth 2 (two) times daily.  60 capsule  5  . donepezil (ARICEPT) 5 MG tablet Take 1 tablet (5 mg total) by mouth at bedtime.  30 tablet  6  . levETIRAcetam (KEPPRA) 250 MG tablet Take 250 mg by mouth 2 (two) times daily.      . memantine (NAMENDA) 10 MG tablet Take 1 tablet (10 mg total) by mouth 2 (two) times daily.  60 tablet  3  . pantoprazole (PROTONIX) 40 MG tablet Take 40 mg by mouth daily.      Marland Kitchen senna-docusate (SENOKOT-S) 8.6-50 MG per tablet Take 1 tablet by mouth 2 (two) times daily.      . traZODone (DESYREL) 50 MG tablet Take 1 tablet (50 mg total) by mouth at bedtime.  30 tablet  11  . warfarin (COUMADIN) 5 MG tablet Take 5 mg by mouth daily.        Allergies  Allergen Reactions  . Morphine And Related     Past Medical History  Diagnosis Date  . Frontotemporal dementia   . Hyperlipidemia   . CVA (cerebral infarction)   . Malnutrition    . Melanoma of back 1988    stage 3--removed and 6 month surveillance since then  . Agitation     Past Surgical History  Procedure Date  . Mri/mra 04/17/11    Normal at [facility]  . Melanoma excision   . Back surgery     twice on low back  . Corneal transplant     needed 3 seperate times    Family History  Problem Relation Age of Onset  . Cancer Mother     lung  . Cancer Father     lung  . Heart disease Father   . Diabetes Sister   . Heart disease Paternal Uncle     all 5 uncles died of MI    History   Social History  . Marital Status: Married    Spouse Name: N/A    Number of Children: 2  . Years of Education: N/A   Occupational History  . School Production designer, theatre/television/film     retired with illness   Social History Main Topics  . Smoking status: Former Games developer  . Smokeless tobacco: Never Used   Comment: quit pipe ~1996 (rare even before this though)  . Alcohol Use: No     Occ social use or daily beer till sick  .  Drug Use: Not on file  . Sexually Active: Not on file   Other Topics Concern  . Not on file   Social History Narrative   2nd marriage for both1 daughter and 1 stepdaughterWife holds health care POAHas DNR orderDoes not want hospitalizationNever discussed tube feedings   Review of Systems Has lost weight since last visit----?12# less  Sleep is variable Wife turns him every 2 hours---this awakens him (or when they have to change him) Has a mobile knot on his penis Has been digging in right ear some---itches Has condom catheter but he doesn't like it and pulls it off when he can     Objective:   Physical Exam  Constitutional: No distress.       In bed  Neck: No thyromegaly present.  Cardiovascular: Normal rate, regular rhythm and normal heart sounds.  Exam reveals no gallop.   No murmur heard. Pulmonary/Chest: Effort normal and breath sounds normal. No respiratory distress. He has no wheezes. He has no rales.  Abdominal: Soft. There is no tenderness.    Genitourinary:       No apparent knot on penis Does have red line across shaft (leftover from condom)  Lymphadenopathy:    He has no cervical adenopathy.  Neurological:       No focal weakness ?slight increased tone  Skin: No rash noted.       No ulcers          Assessment & Plan:

## 2011-12-11 NOTE — Assessment & Plan Note (Signed)
Still very severe at times Kicks, spits, etc Hard to get the lorazepam in him Will change to lorazepam gel to facilitate its use

## 2011-12-11 NOTE — Assessment & Plan Note (Signed)
On coumadin No findings on exam now Will continue for 6 months and then stop (unless there is a major change in his status---then I would consider 3 months only)

## 2011-12-11 NOTE — Assessment & Plan Note (Signed)
Eating is difficult Down to 2 meals a day and not drinking well Has lost some weight---mostly associated with the hospitalization Will continue to support him as much as possible

## 2011-12-13 ENCOUNTER — Other Ambulatory Visit: Payer: Self-pay | Admitting: Internal Medicine

## 2011-12-25 ENCOUNTER — Telehealth: Payer: Self-pay | Admitting: Internal Medicine

## 2011-12-25 NOTE — Telephone Encounter (Signed)
Call from hospice nurse Olegario Messier  PT INR 22.2/2.2  Coumadin 5mg  daily  Will continue current dose and recheck in 1 month  Is doing a little better with lorazepam topically

## 2011-12-30 DIAGNOSIS — I6990 Unspecified sequelae of unspecified cerebrovascular disease: Secondary | ICD-10-CM | POA: Diagnosis not present

## 2011-12-30 DIAGNOSIS — I679 Cerebrovascular disease, unspecified: Secondary | ICD-10-CM | POA: Diagnosis not present

## 2011-12-30 DIAGNOSIS — F0280 Dementia in other diseases classified elsewhere without behavioral disturbance: Secondary | ICD-10-CM | POA: Diagnosis not present

## 2011-12-30 DIAGNOSIS — F05 Delirium due to known physiological condition: Secondary | ICD-10-CM | POA: Diagnosis not present

## 2012-01-22 ENCOUNTER — Telehealth: Payer: Self-pay | Admitting: Internal Medicine

## 2012-01-22 NOTE — Telephone Encounter (Signed)
Phone call earlier from [place], hospice RN Protime 17.8 and INR 1.8 Taking 5 mg daily  Will continue current dose  Recheck in 2 weeks

## 2012-01-29 DIAGNOSIS — I6990 Unspecified sequelae of unspecified cerebrovascular disease: Secondary | ICD-10-CM | POA: Diagnosis not present

## 2012-01-29 DIAGNOSIS — I679 Cerebrovascular disease, unspecified: Secondary | ICD-10-CM | POA: Diagnosis not present

## 2012-01-29 DIAGNOSIS — F05 Delirium due to known physiological condition: Secondary | ICD-10-CM | POA: Diagnosis not present

## 2012-01-29 DIAGNOSIS — F0280 Dementia in other diseases classified elsewhere without behavioral disturbance: Secondary | ICD-10-CM | POA: Diagnosis not present

## 2012-01-30 ENCOUNTER — Ambulatory Visit: Payer: Self-pay | Admitting: Internal Medicine

## 2012-02-06 ENCOUNTER — Ambulatory Visit: Admitting: Internal Medicine

## 2012-02-06 ENCOUNTER — Encounter: Payer: Self-pay | Admitting: Internal Medicine

## 2012-02-06 VITALS — BP 147/78 | HR 65 | Resp 16

## 2012-02-06 DIAGNOSIS — I6350 Cerebral infarction due to unspecified occlusion or stenosis of unspecified cerebral artery: Secondary | ICD-10-CM | POA: Diagnosis not present

## 2012-02-06 DIAGNOSIS — G3109 Other frontotemporal dementia: Secondary | ICD-10-CM

## 2012-02-06 DIAGNOSIS — IMO0002 Reserved for concepts with insufficient information to code with codable children: Secondary | ICD-10-CM | POA: Diagnosis not present

## 2012-02-06 DIAGNOSIS — I82629 Acute embolism and thrombosis of deep veins of unspecified upper extremity: Secondary | ICD-10-CM | POA: Diagnosis not present

## 2012-02-06 DIAGNOSIS — F0280 Dementia in other diseases classified elsewhere without behavioral disturbance: Secondary | ICD-10-CM | POA: Diagnosis not present

## 2012-02-06 DIAGNOSIS — I639 Cerebral infarction, unspecified: Secondary | ICD-10-CM

## 2012-02-06 DIAGNOSIS — R451 Restlessness and agitation: Secondary | ICD-10-CM

## 2012-02-06 NOTE — Patient Instructions (Signed)
Please stop the warfarin Cut the keppra down to daily for 2 weeks and then stop if he is stable

## 2012-02-06 NOTE — Progress Notes (Signed)
Subjective:    Patient ID: Tanner Rosario, male    DOB: Jul 25, 1945, 66 y.o.   MRN: [id number]  HPI Aide Rollene Fare, wife and hospice nurse Olegario Messier are here  More talkative now But variable Just going through hospice recertification process--this is making wife very nervous Couldn't continue his care without hospice Still has 24/7 care for now---wife plans to reduce and stop night care to save dwindling money Will probably go back to the condom catheter  Total care Lift for transfers Totally incontinent---they are careful with changing him so no skin breakdown Usually eats 2 meals and occ 3 meals Often has to be fed They work on urging him to drink May take as much as an hour to feed him a meal  Mood lability is much better back on the nuedexta Has stable caregivers now--this seems to help keep him calm  protime yesterday was INR of 1.6 DVT was 3 months No arm swelling since Rx  Appetite is off some Weight seems to be stable Seems to have a lot of indigestion and burping despite the protonix No clear regurgitation though No choking but occ drinks too fast and he will cough  Current Outpatient Prescriptions on File Prior to Visit  Medication Sig Dispense Refill  . cholecalciferol (VITAMIN D) 1000 UNITS tablet Take 1,000 Units by mouth daily.      Marland Kitchen Dextromethorphan-Quinidine (NUEDEXTA) 20-10 MG CAPS Take 1 capsule by mouth 2 (two) times daily.  60 capsule  5  . donepezil (ARICEPT) 5 MG tablet Take 1 tablet (5 mg total) by mouth at bedtime.  30 tablet  6  . levETIRAcetam (KEPPRA) 250 MG tablet Take 250 mg by mouth 2 (two) times daily.      Marland Kitchen LORazepam (ATIVAN) 1 MG tablet Take 1 mg by mouth every 4 (four) hours as needed.      . memantine (NAMENDA) 10 MG tablet Take 1 tablet (10 mg total) by mouth 2 (two) times daily.  60 tablet  3  . pantoprazole (PROTONIX) 40 MG tablet Take 40 mg by mouth daily.      Marland Kitchen senna-docusate (SENOKOT-S) 8.6-50 MG per tablet Take 1 tablet by mouth 2  (two) times daily.      . traMADol (ULTRAM) 50 MG tablet Take 50 mg by mouth every 4 (four) hours as needed.      . traZODone (DESYREL) 50 MG tablet Take 1 tablet (50 mg total) by mouth at bedtime.  30 tablet  11  . warfarin (COUMADIN) 5 MG tablet TAKE 1 TABLET EVERY DAY FOR TEN DAYS  30 tablet  11    Allergies  Allergen Reactions  . Morphine And Related     Past Medical History  Diagnosis Date  . Frontotemporal dementia   . Hyperlipidemia   . CVA (cerebral infarction)   . Malnutrition   . Melanoma of back 1988    stage 3--removed and 6 month surveillance since then  . Agitation     Past Surgical History  Procedure Date  . Mri/mra 04/17/11    Normal at [facility]  . Melanoma excision   . Back surgery     twice on low back  . Corneal transplant     needed 3 seperate times    Family History  Problem Relation Age of Onset  . Cancer Mother     lung  . Cancer Father     lung  . Heart disease Father   . Diabetes Sister   . Heart disease Paternal  Uncle     all 5 uncles died of MI    History   Social History  . Marital Status: Married    Spouse Name: N/A    Number of Children: 2  . Years of Education: N/A   Occupational History  . School Production designer, theatre/television/film     retired with illness   Social History Main Topics  . Smoking status: Former Games developer  . Smokeless tobacco: Never Used   Comment: quit pipe ~1996 (rare even before this though)  . Alcohol Use: No     Occ social use or daily beer till sick  . Drug Use: Not on file  . Sexually Active: Not on file   Other Topics Concern  . Not on file   Social History Narrative   2nd marriage for both1 daughter and 1 stepdaughterWife holds health care POAHas DNR orderDoes not want hospitalizationNever discussed tube feedings   Review of Systems Bowels have been okay No rash or ulcers     Objective:   Physical Exam  Constitutional: He appears well-developed. No distress.  Neck: Normal range of motion. No thyromegaly present.   Cardiovascular: Normal rate, regular rhythm and normal heart sounds.   Pulmonary/Chest: Effort normal and breath sounds normal. No respiratory distress. He has no wheezes. He has no rales.  Abdominal: Soft. There is no tenderness.  Musculoskeletal: He exhibits no edema.  Lymphadenopathy:    He has no cervical adenopathy.  Neurological: He is alert.       Some rote answers Doesn't engage unless directly addressed Follows some commands  ?mild increased tone  Skin: No rash noted.          Assessment & Plan:

## 2012-02-06 NOTE — Assessment & Plan Note (Signed)
No symptoms now Has had coumadin for 3 months Will stop the coumadin and follow clinically  If any recurrence of swelling, would restart coumadin (and lovenox)

## 2012-02-06 NOTE — Assessment & Plan Note (Signed)
Severe Continues to require 24/7 care and is still hospice appropriate based on functional deficits and care needs Will continue the meds

## 2012-02-06 NOTE — Assessment & Plan Note (Signed)
May be contributing factor to the dementia Total care including lift for transfers

## 2012-02-06 NOTE — Assessment & Plan Note (Signed)
better back on the neudexta Will try to wean of the keppra since this was started in [place] to help control agitation

## 2012-02-25 ENCOUNTER — Other Ambulatory Visit: Payer: Self-pay | Admitting: Internal Medicine

## 2012-02-29 DIAGNOSIS — I679 Cerebrovascular disease, unspecified: Secondary | ICD-10-CM | POA: Diagnosis not present

## 2012-02-29 DIAGNOSIS — F05 Delirium due to known physiological condition: Secondary | ICD-10-CM | POA: Diagnosis not present

## 2012-02-29 DIAGNOSIS — I6990 Unspecified sequelae of unspecified cerebrovascular disease: Secondary | ICD-10-CM | POA: Diagnosis not present

## 2012-02-29 DIAGNOSIS — F0280 Dementia in other diseases classified elsewhere without behavioral disturbance: Secondary | ICD-10-CM | POA: Diagnosis not present

## 2012-03-11 ENCOUNTER — Other Ambulatory Visit: Payer: Self-pay | Admitting: Internal Medicine

## 2012-03-12 DIAGNOSIS — F0151 Vascular dementia with behavioral disturbance: Secondary | ICD-10-CM

## 2012-03-12 DIAGNOSIS — I679 Cerebrovascular disease, unspecified: Secondary | ICD-10-CM | POA: Diagnosis not present

## 2012-03-12 DIAGNOSIS — F0280 Dementia in other diseases classified elsewhere without behavioral disturbance: Secondary | ICD-10-CM

## 2012-03-12 DIAGNOSIS — I6990 Unspecified sequelae of unspecified cerebrovascular disease: Secondary | ICD-10-CM | POA: Diagnosis not present

## 2012-03-12 DIAGNOSIS — R410 Disorientation, unspecified: Secondary | ICD-10-CM

## 2012-03-12 DIAGNOSIS — G3109 Other frontotemporal dementia: Secondary | ICD-10-CM | POA: Diagnosis not present

## 2012-03-30 DIAGNOSIS — I6990 Unspecified sequelae of unspecified cerebrovascular disease: Secondary | ICD-10-CM | POA: Diagnosis not present

## 2012-03-30 DIAGNOSIS — I679 Cerebrovascular disease, unspecified: Secondary | ICD-10-CM | POA: Diagnosis not present

## 2012-03-30 DIAGNOSIS — F0280 Dementia in other diseases classified elsewhere without behavioral disturbance: Secondary | ICD-10-CM | POA: Diagnosis not present

## 2012-03-30 DIAGNOSIS — F05 Delirium due to known physiological condition: Secondary | ICD-10-CM | POA: Diagnosis not present

## 2012-04-16 ENCOUNTER — Ambulatory Visit: Payer: Medicare Other | Admitting: Internal Medicine

## 2012-04-16 ENCOUNTER — Encounter: Payer: Self-pay | Admitting: Internal Medicine

## 2012-04-16 VITALS — BP 122/78 | HR 60 | Resp 14

## 2012-04-16 DIAGNOSIS — I82629 Acute embolism and thrombosis of deep veins of unspecified upper extremity: Secondary | ICD-10-CM

## 2012-04-16 DIAGNOSIS — R21 Rash and other nonspecific skin eruption: Secondary | ICD-10-CM | POA: Insufficient documentation

## 2012-04-16 DIAGNOSIS — IMO0002 Reserved for concepts with insufficient information to code with codable children: Secondary | ICD-10-CM

## 2012-04-16 DIAGNOSIS — G3109 Other frontotemporal dementia: Secondary | ICD-10-CM

## 2012-04-16 DIAGNOSIS — F0280 Dementia in other diseases classified elsewhere without behavioral disturbance: Secondary | ICD-10-CM

## 2012-04-16 DIAGNOSIS — R451 Restlessness and agitation: Secondary | ICD-10-CM

## 2012-04-16 NOTE — Assessment & Plan Note (Signed)
Has worsened Will restart the keppra--may even want to consider increasing this Lorazepam not clearly helpful or takes too long

## 2012-04-16 NOTE — Assessment & Plan Note (Signed)
No new exposures  No clear help with the moisturizers and triamcinolone Doesn't seem to be med related

## 2012-04-16 NOTE — Assessment & Plan Note (Addendum)
Severe Requires care for all ADLs including lift for transfers Eating is worse though not clear if he has lost weight Still on hospice

## 2012-04-16 NOTE — Assessment & Plan Note (Signed)
No swelling or apparent recurrence off the coumadin

## 2012-04-16 NOTE — Patient Instructions (Signed)
Can try to decrease the pantoprazole and then stop it if he is doing well

## 2012-04-16 NOTE — Progress Notes (Signed)
Subjective:    Patient ID: Tanner Rosario, male    DOB: [birth year], 66 y.o.   MRN: [id number]  HPI Wife and aide are here Tanner Rosario --hospice nurse is here  Now off the keppra Still agitated, if awoken to get changed or when they bathe him By the time it starts, the lorazepam doesn't work fast enough to help Scratches, etc They note a differnece off the keppra  Appetite varies a lot Good at times and very little other times Not much for fluids Wife tries to give fruits also  Urine is concentrated Occ seems to bother her as he voids---not clear signs of illness  No recurrence of arm swelling Has been off the coumadin for about 2 months  Has had rash for about 2 weeks In groin and up to chest No new exposures Using triamcinolone and dermacloud  Current Outpatient Prescriptions on File Prior to Visit  Medication Sig Dispense Refill  . cholecalciferol (VITAMIN D) 1000 UNITS tablet Take 1,000 Units by mouth daily.      Marland Kitchen Dextromethorphan-Quinidine (NUEDEXTA) 20-10 MG CAPS Take 1 capsule by mouth 2 (two) times daily.  60 capsule  5  . donepezil (ARICEPT) 5 MG tablet Take 1 tablet (5 mg total) by mouth at bedtime.  30 tablet  6  . Infant Care Products (DERMACLOUD) CREA Apply topically.      Marland Kitchen LORazepam (ATIVAN) 1 MG tablet Take 1 mg by mouth every 4 (four) hours as needed.      Marland Kitchen NAMENDA 10 MG tablet TAKE ONE TABLET TWICE A DAY  60 tablet  11  . pantoprazole (PROTONIX) 40 MG tablet TAKE ONE TABLET EVERYDAY FOR GERD  30 tablet  11  . senna-docusate (SENOKOT-S) 8.6-50 MG per tablet Take 1 tablet by mouth 2 (two) times daily.      . traZODone (DESYREL) 50 MG tablet Take 1 tablet (50 mg total) by mouth at bedtime.  30 tablet  11    Allergies  Allergen Reactions  . Morphine And Related     Past Medical History  Diagnosis Date  . Frontotemporal dementia   . Hyperlipidemia   . CVA (cerebral infarction)   . Malnutrition   . Melanoma of back 1988    stage 3--removed and 6 month  surveillance since then  . Agitation     Past Surgical History  Procedure Date  . Mri/mra 04/17/11    Normal at [facility]  . Melanoma excision   . Back surgery     twice on low back  . Corneal transplant     needed 3 seperate times    Family History  Problem Relation Age of Onset  . Cancer Mother     lung  . Cancer Father     lung  . Heart disease Father   . Diabetes Sister   . Heart disease Paternal Uncle     all 5 uncles died of MI    History   Social History  . Marital Status: Married    Spouse Name: N/A    Number of Children: 2  . Years of Education: N/A   Occupational History  . School Production designer, theatre/television/film     retired with illness   Social History Main Topics  . Smoking status: Former Games developer  . Smokeless tobacco: Never Used     Comment: quit pipe ~1996 (rare even before this though)  . Alcohol Use: No     Comment: Occ social use or daily beer till sick  . Drug  Use: Not on file  . Sexually Active: Not on file   Other Topics Concern  . Not on file   Social History Narrative   2nd marriage for both1 daughter and 1 stepdaughterWife holds health care POAHas DNR orderDoes not want hospitalizationNever discussed tube feedings   Review of Systems Sleeps okay Weight seems to be stable Stomach seems okay---does burp a lot Has had rare cough after drinking---no apparent aspiration    Objective:   Physical Exam  Constitutional: He appears well-developed. No distress.  Neck: No thyromegaly present.  Cardiovascular: Normal rate, regular rhythm and normal heart sounds.  Exam reveals no gallop.   No murmur heard. Pulmonary/Chest: Effort normal and breath sounds normal. No respiratory distress. He has no wheezes. He has no rales.  Abdominal: Soft. There is no tenderness.  Lymphadenopathy:    He has no cervical adenopathy.  Neurological:       Moves his extremities some Echolalia and a few independent words Doesn't really engage   Skin: Rash noted.       Red macules  and papules mostly along lower chest --between breasts and abdomen No vesicles Rare on upper thighs as well  Psychiatric:       Calm now          Assessment & Plan:

## 2012-04-28 ENCOUNTER — Other Ambulatory Visit: Payer: Self-pay | Admitting: *Deleted

## 2012-04-28 MED ORDER — DEXTROMETHORPHAN-QUINIDINE 20-10 MG PO CAPS: 1.0000 | ORAL_CAPSULE | Freq: Two times a day (BID) | ORAL | Status: DC

## 2012-04-28 NOTE — Telephone Encounter (Signed)
rx sent to pharmacy by e-script  

## 2012-04-28 NOTE — Telephone Encounter (Signed)
Okay #60 x 5 

## 2012-04-30 DIAGNOSIS — I6990 Unspecified sequelae of unspecified cerebrovascular disease: Secondary | ICD-10-CM | POA: Diagnosis not present

## 2012-04-30 DIAGNOSIS — I679 Cerebrovascular disease, unspecified: Secondary | ICD-10-CM | POA: Diagnosis not present

## 2012-04-30 DIAGNOSIS — F0280 Dementia in other diseases classified elsewhere without behavioral disturbance: Secondary | ICD-10-CM | POA: Diagnosis not present

## 2012-04-30 DIAGNOSIS — F05 Delirium due to known physiological condition: Secondary | ICD-10-CM | POA: Diagnosis not present

## 2012-05-03 ENCOUNTER — Other Ambulatory Visit: Payer: Self-pay | Admitting: Internal Medicine

## 2012-05-06 ENCOUNTER — Telehealth: Payer: Self-pay

## 2012-05-06 NOTE — Telephone Encounter (Signed)
Message left Okay to try ketoconazole 2% cream bid prn Order given

## 2012-05-06 NOTE — Telephone Encounter (Signed)
Sue Lush nurse with [facility] left v/m; pt has raised red pimple rash on torso; Dr Alphonsus Sias saw rash 2 weeks ago at home visit and ordered Dermacloud cream and hydrocortisone. Pts wife request different med ordered; Sue Lush said rash looks worse today, heat to rash today, more redness. Pt has dementia so not sure if itching. Sue Lush request call back.

## 2012-05-08 ENCOUNTER — Telehealth: Payer: Self-pay

## 2012-05-08 NOTE — Telephone Encounter (Signed)
Tanner Rosario with [facility] left v/m requesting call back; Hospice is working on discharge plan for pt and pts wife wanted to know due to cost factor if pt would need to continue taking both Namenda and Aricept, Vit D 3 and Nuedexta is a tier 3 which cost $40/month.(Upon discharge hospice will not help with cost of meds). Pt needs written rx for diagnosis for electric hospital bed, hoyer lift and high back wheel chair with elevated leg lift and rx for extra large diapers,chucks and gloves. Pt is total care and incontinent.Tanner Rosario request call back.

## 2012-05-08 NOTE — Telephone Encounter (Signed)
Really has declined with increased agitation despite current meds Will increase the trazodone to 100mg  since not sleeping well May be discharged but I will plan visit before that happens in March if occurs  Will stop aricept--not effective Will increase keppra to 500mg  bid  May need to try using the lorazepam which he hasn't been getting for the most part

## 2012-05-09 DIAGNOSIS — G3109 Other frontotemporal dementia: Secondary | ICD-10-CM | POA: Diagnosis not present

## 2012-05-09 DIAGNOSIS — F0280 Dementia in other diseases classified elsewhere without behavioral disturbance: Secondary | ICD-10-CM

## 2012-05-09 DIAGNOSIS — I6990 Unspecified sequelae of unspecified cerebrovascular disease: Secondary | ICD-10-CM

## 2012-05-09 DIAGNOSIS — I679 Cerebrovascular disease, unspecified: Secondary | ICD-10-CM

## 2012-05-09 DIAGNOSIS — F0151 Vascular dementia with behavioral disturbance: Secondary | ICD-10-CM

## 2012-05-09 DIAGNOSIS — R410 Disorientation, unspecified: Secondary | ICD-10-CM

## 2012-05-16 ENCOUNTER — Telehealth: Payer: Self-pay | Admitting: Internal Medicine

## 2012-05-16 NOTE — Telephone Encounter (Signed)
Andrea,Hospice,called to find out if you can do a home visit with patient on 05/21/12.  Please call Sue Lush with time.

## 2012-05-16 NOTE — Telephone Encounter (Signed)
I will see him on the 22nd I will call Sue Lush and wife

## 2012-05-21 ENCOUNTER — Ambulatory Visit: Payer: Medicare Other | Admitting: Internal Medicine

## 2012-05-21 ENCOUNTER — Encounter: Payer: Self-pay | Admitting: Internal Medicine

## 2012-05-21 VITALS — BP 122/84 | HR 76 | Resp 14

## 2012-05-21 DIAGNOSIS — G3109 Other frontotemporal dementia: Secondary | ICD-10-CM

## 2012-05-21 DIAGNOSIS — IMO0002 Reserved for concepts with insufficient information to code with codable children: Secondary | ICD-10-CM | POA: Diagnosis not present

## 2012-05-21 DIAGNOSIS — R21 Rash and other nonspecific skin eruption: Secondary | ICD-10-CM | POA: Diagnosis not present

## 2012-05-21 DIAGNOSIS — R451 Restlessness and agitation: Secondary | ICD-10-CM

## 2012-05-21 DIAGNOSIS — F0280 Dementia in other diseases classified elsewhere without behavioral disturbance: Secondary | ICD-10-CM

## 2012-05-21 DIAGNOSIS — G479 Sleep disorder, unspecified: Secondary | ICD-10-CM | POA: Diagnosis not present

## 2012-05-21 NOTE — Assessment & Plan Note (Signed)
Seems to be better with the increased trazodone ?related to increased sleepiness----not likely. If alprazolam calms him for care, will consider decreasing dose

## 2012-05-21 NOTE — Progress Notes (Signed)
Subjective:    Patient ID: Tanner Rosario, male    DOB: 10-30-1945, 67 y.o.   MRN: [id number]  HPI Wife, daughter and sitter are here Sue Lush hospice RN also  here [place] [facility] is here  Still with agitation Lorazepam gel not effective Tried 0.5mg  bid before care--didn't really help much and it clearly caused sedation Still will be physically aggressive at times  Despite some decline, he is not fulfilling hospice requirements so he is going off hospice care Now requires total care and has worsened in the past couple of weeks  Still requires semi-electric bed.  He does have swallowing problems and has aspiration risk. His HOB is kept up about 30 degrees to prevent aspiration. Bed wedges are not effective due to his cognitive problems and  he removes them. He requires transfers with a lift and the bed height must be variable. Manual transfers would require at least 3 people and this is not feasible. He requires repositioning for personal and incontinence care that would not be possible in a standard bed.  Also requires standard weight reclining back wheelchair with leg rests. He has edema with dependent positioning that is from venous insufficiency and they need to raise his feet. Reclining back is due to inability to maintain an upright posture---he will lean and even fall out of a straight back wheechair. He is not ambulatory so he requires the wheelchair to be able to be moved around his residence to be able to eat in kitchen. He has constant caregiver who can push him in the wheelchair in his home.  He requires Hoyer lift for transfers. His approximate weight is 210-220# at least. He would require at least 3 people for transfers and this is not feasible. He is unable to bear any weight or assist in any transfers  Current Outpatient Prescriptions on File Prior to Visit  Medication Sig Dispense Refill  . cholecalciferol (VITAMIN D) 1000 UNITS tablet Take 1,000 Units by mouth  daily.      Marland Kitchen Dextromethorphan-Quinidine (NUEDEXTA) 20-10 MG CAPS Take 1 capsule by mouth 2 (two) times daily.  60 capsule  5  . Infant Care Products (DERMACLOUD) CREA Apply topically.      . levETIRAcetam (KEPPRA) 250 MG tablet Take 500 mg by mouth 2 (two) times daily.       Marland Kitchen NAMENDA 10 MG tablet TAKE ONE TABLET TWICE A DAY  60 tablet  11  . pantoprazole (PROTONIX) 40 MG tablet TAKE ONE TABLET EVERYDAY FOR GERD  30 tablet  11  . senna-docusate (SENOKOT-S) 8.6-50 MG per tablet Take 1 tablet by mouth 2 (two) times daily.      . traZODone (DESYREL) 50 MG tablet Take 100 mg by mouth at bedtime.        Allergies  Allergen Reactions  . Morphine And Related     Past Medical History  Diagnosis Date  . Frontotemporal dementia   . Hyperlipidemia   . CVA (cerebral infarction)   . Malnutrition   . Melanoma of back 1988    stage 3--removed and 6 month surveillance since then  . Agitation     Past Surgical History  Procedure Date  . Mri/mra 04/17/11    Normal at [facility]  . Melanoma excision   . Back surgery     twice on low back  . Corneal transplant     needed 3 seperate times    Family History  Problem Relation Age of Onset  . Cancer Mother  lung  . Cancer Father     lung  . Heart disease Father   . Diabetes Sister   . Heart disease Paternal Uncle     all 5 uncles died of MI    History   Social History  . Marital Status: Married    Spouse Name: N/A    Number of Children: 2  . Years of Education: N/A   Occupational History  . School Production designer, theatre/television/film     retired with illness   Social History Main Topics  . Smoking status: Former Games developer  . Smokeless tobacco: Never Used     Comment: quit pipe ~1996 (rare even before this though)  . Alcohol Use: No     Comment: Occ social use or daily beer till sick  . Drug Use: Not on file  . Sexually Active: Not on file   Other Topics Concern  . Not on file   Social History Narrative   2nd marriage for both1 daughter and 1  stepdaughterWife holds health care POAHas DNR orderDoes not want hospitalizationNever discussed tube feedings   Review of Systems Has had some agitation with voiding and moving bowels today--this is new (it was soft but very large) Hasn't been sick Does hold himself in front if voiding--but not clearly because of discomfort Eating is very variable--have to feed him and he keeps his eyes closed. May eat 2 meals a day Sleeps well now with the increased trazodone    Objective:   Physical Exam  Constitutional: He appears well-nourished. No distress.  Neck: No thyromegaly present.  Cardiovascular: Normal rate, regular rhythm and normal heart sounds.  Exam reveals no gallop.   No murmur heard. Pulmonary/Chest: Breath sounds normal. No respiratory distress. He has no wheezes. He has no rales.  Abdominal: Soft. There is no tenderness.  Musculoskeletal: He exhibits no tenderness.       Slight calf edema  Lymphadenopathy:    He has no cervical adenopathy.  Neurological:       Alert but doesn't really engage Mild stiffness No words today  Psychiatric:       Passive now Not agitated during this visit          Assessment & Plan:

## 2012-05-21 NOTE — Assessment & Plan Note (Signed)
Has clearly worsened Now requires total care---even feeding  I believe he is still appropriate for hospice---- early age dementia patients, esp men, have a tendency to progress rapidly. I believe it is very likely that he could progress to death from this disease within the next 6 months

## 2012-05-21 NOTE — Assessment & Plan Note (Signed)
Has improved some

## 2012-05-21 NOTE — Assessment & Plan Note (Addendum)
Has worsened Lorazepam not helping--does not calm for personal care but seems sedated later Will try alprazolam as it may act quicker and allow safer personal care (still has sig physical aggression)  Can consider decreasing the keppra also if the alprazolam helps (but I doubt it is the reason for his increased sedation)

## 2012-05-23 ENCOUNTER — Telehealth: Payer: Self-pay | Admitting: Internal Medicine

## 2012-05-23 NOTE — Telephone Encounter (Signed)
Phone call from [place] of hospice last night Having cough and fever  Okay to use robitussin Observe otherwise---if worsens start augmentin ES 7.5cc bid for 10 days (need to consider possibility of aspiration)

## 2012-05-26 ENCOUNTER — Other Ambulatory Visit: Payer: Self-pay | Admitting: *Deleted

## 2012-05-26 MED ORDER — LEVETIRACETAM 500 MG PO TABS: 500.0000 mg | ORAL_TABLET | Freq: Two times a day (BID) | ORAL | Status: DC

## 2012-05-26 NOTE — Telephone Encounter (Signed)
Okay to refill for a year May want to fill the 1 500mg  bid instead of 250mg  (I increased the dose)

## 2012-05-26 NOTE — Telephone Encounter (Signed)
Ok to refill 

## 2012-05-26 NOTE — Telephone Encounter (Signed)
rx sent to pharmacy by e-script Med list updated 

## 2012-05-27 ENCOUNTER — Telehealth: Payer: Self-pay | Admitting: *Deleted

## 2012-05-27 NOTE — Telephone Encounter (Signed)
Request your call to give you report on pt after uri and med changes last week.

## 2012-05-27 NOTE — Telephone Encounter (Signed)
Doing better Much better with care with the xanax Antibiotic seems to be helping as well  Still sleepy at times Not sure if this is related to the trazodone Okay to try to decrease to 50  Has meeting today at Hospice Hopefully will be recertified

## 2012-05-31 DIAGNOSIS — I6990 Unspecified sequelae of unspecified cerebrovascular disease: Secondary | ICD-10-CM | POA: Diagnosis not present

## 2012-05-31 DIAGNOSIS — F05 Delirium due to known physiological condition: Secondary | ICD-10-CM | POA: Diagnosis not present

## 2012-05-31 DIAGNOSIS — I679 Cerebrovascular disease, unspecified: Secondary | ICD-10-CM | POA: Diagnosis not present

## 2012-05-31 DIAGNOSIS — F0280 Dementia in other diseases classified elsewhere without behavioral disturbance: Secondary | ICD-10-CM | POA: Diagnosis not present

## 2012-06-02 ENCOUNTER — Telehealth: Payer: Self-pay | Admitting: Internal Medicine

## 2012-06-02 NOTE — Telephone Encounter (Signed)
Pt has around the clock care in the home.  Their insurance changed to [employer].  His wife is trying to take care of things for him with [facility] but is unable to do so without a letter from Dr. Alphonsus Sias stating Rishav is incapacitated, cannot answer questions, or make any decisions. Pt's wife did already send the  POA  Send letter to  [employer] [address] [place], [place], [id number]

## 2012-06-03 ENCOUNTER — Encounter: Payer: Self-pay | Admitting: Internal Medicine

## 2012-06-03 NOTE — Telephone Encounter (Signed)
I notified patient's wife that I mailed letter to [employer].

## 2012-06-03 NOTE — Telephone Encounter (Signed)
Please send and let wife know it is done

## 2012-06-26 ENCOUNTER — Other Ambulatory Visit: Payer: Self-pay | Admitting: Internal Medicine

## 2012-06-26 NOTE — Telephone Encounter (Signed)
rx sent to pharmacy by e-script  

## 2012-06-26 NOTE — Telephone Encounter (Signed)
Okay to refill for a year 

## 2012-06-28 DIAGNOSIS — I6990 Unspecified sequelae of unspecified cerebrovascular disease: Secondary | ICD-10-CM | POA: Diagnosis not present

## 2012-06-28 DIAGNOSIS — F0280 Dementia in other diseases classified elsewhere without behavioral disturbance: Secondary | ICD-10-CM | POA: Diagnosis not present

## 2012-06-28 DIAGNOSIS — I679 Cerebrovascular disease, unspecified: Secondary | ICD-10-CM | POA: Diagnosis not present

## 2012-06-28 DIAGNOSIS — F05 Delirium due to known physiological condition: Secondary | ICD-10-CM | POA: Diagnosis not present

## 2012-07-17 DIAGNOSIS — F0280 Dementia in other diseases classified elsewhere without behavioral disturbance: Secondary | ICD-10-CM

## 2012-07-17 DIAGNOSIS — I6990 Unspecified sequelae of unspecified cerebrovascular disease: Secondary | ICD-10-CM | POA: Diagnosis not present

## 2012-07-17 DIAGNOSIS — I679 Cerebrovascular disease, unspecified: Secondary | ICD-10-CM

## 2012-07-17 DIAGNOSIS — F0151 Vascular dementia with behavioral disturbance: Secondary | ICD-10-CM

## 2012-07-17 DIAGNOSIS — G3109 Other frontotemporal dementia: Secondary | ICD-10-CM

## 2012-07-17 DIAGNOSIS — R410 Disorientation, unspecified: Secondary | ICD-10-CM

## 2012-07-29 ENCOUNTER — Telehealth: Payer: Self-pay

## 2012-07-29 ENCOUNTER — Encounter: Payer: Self-pay | Admitting: Internal Medicine

## 2012-07-29 DIAGNOSIS — Z0279 Encounter for issue of other medical certificate: Secondary | ICD-10-CM

## 2012-07-29 DIAGNOSIS — I6990 Unspecified sequelae of unspecified cerebrovascular disease: Secondary | ICD-10-CM | POA: Diagnosis not present

## 2012-07-29 DIAGNOSIS — F0280 Dementia in other diseases classified elsewhere without behavioral disturbance: Secondary | ICD-10-CM | POA: Diagnosis not present

## 2012-07-29 DIAGNOSIS — I679 Cerebrovascular disease, unspecified: Secondary | ICD-10-CM | POA: Diagnosis not present

## 2012-07-29 DIAGNOSIS — F05 Delirium due to known physiological condition: Secondary | ICD-10-CM | POA: Diagnosis not present

## 2012-07-29 NOTE — Telephone Encounter (Signed)
Letter done Let wife know $20

## 2012-07-29 NOTE — Telephone Encounter (Signed)
pts wife left v/m; needs letter stating pt needs round the clock care; pt is unable to walk or feed himself. Mrs Nickson needs for insurance claim.Please advise.

## 2012-07-29 NOTE — Telephone Encounter (Signed)
Left message with person at patient's home.  Notified of charge and letter mailed to patient's wife.

## 2012-08-12 ENCOUNTER — Ambulatory Visit: Payer: Medicare Other | Admitting: Internal Medicine

## 2012-08-12 ENCOUNTER — Encounter: Payer: Self-pay | Admitting: Internal Medicine

## 2012-08-12 VITALS — BP 128/62 | HR 61 | Resp 16

## 2012-08-12 DIAGNOSIS — G3109 Other frontotemporal dementia: Secondary | ICD-10-CM | POA: Diagnosis not present

## 2012-08-12 DIAGNOSIS — G479 Sleep disorder, unspecified: Secondary | ICD-10-CM | POA: Diagnosis not present

## 2012-08-12 DIAGNOSIS — F0280 Dementia in other diseases classified elsewhere without behavioral disturbance: Secondary | ICD-10-CM

## 2012-08-12 DIAGNOSIS — R066 Hiccough: Secondary | ICD-10-CM | POA: Diagnosis not present

## 2012-08-12 DIAGNOSIS — IMO0002 Reserved for concepts with insufficient information to code with codable children: Secondary | ICD-10-CM | POA: Diagnosis not present

## 2012-08-12 DIAGNOSIS — R451 Restlessness and agitation: Secondary | ICD-10-CM

## 2012-08-12 NOTE — Assessment & Plan Note (Signed)
Severe and total care May not be getting much out of being up in chair---seems that he may be tiring Discussed trying to get him up for lunch and supper but only for 2-3 hours before he tires

## 2012-08-12 NOTE — Assessment & Plan Note (Signed)
Ongoing  On the nudexta--didn't tolerate wean keppra is for mood stabilization also Xanax prn for agitation and to facilitate bathing

## 2012-08-12 NOTE — Progress Notes (Signed)
Subjective:    Patient ID: Tanner Rosario, male    DOB: 1945/09/06, 67 y.o.   MRN: [id number]  HPI Wife, aide and Aram Beecham, hospice RN here Still on hospice Status has stayed poor Remains total care Incontinent of bowel and bladder Lift for transfers--- bathed in AM then up in wheelchair or recliner for about 4 hours Needs to be fed  When sitting up, he will hold himself stiff and shake/shiver (in chair or recliner) Wife wonders if that is pain---but only happens when he is voiding Doesn't happen in bed apparently  Still gets agitated If he is wet, resists getting changed-----kicking, cussing, etc May interact with caregivers at times, other times he keeps his eyes closed and doesn't interact Will make a fist and swing arms also  Has been sleeping okay On the trazodone Wife tries to keep schedule with afternoon nap  Current Outpatient Prescriptions on File Prior to Visit  Medication Sig Dispense Refill  . ALPRAZolam (XANAX) 0.25 MG tablet Take 0.25-0.5 mg by mouth 3 (three) times daily as needed. Before baths regularly      . cholecalciferol (VITAMIN D) 1000 UNITS tablet Take 1,000 Units by mouth daily.      Marland Kitchen Dextromethorphan-Quinidine (NUEDEXTA) 20-10 MG CAPS Take 1 capsule by mouth 2 (two) times daily.  60 capsule  5  . Infant Care Products (DERMACLOUD) CREA Apply topically.      . levETIRAcetam (KEPPRA) 500 MG tablet Take 1 tablet (500 mg total) by mouth 2 (two) times daily.  60 tablet  11  . NAMENDA 10 MG tablet TAKE ONE TABLET TWICE A DAY  60 tablet  11  . senna-docusate (SENOKOT-S) 8.6-50 MG per tablet Take 1 tablet by mouth 2 (two) times daily.      . traZODone (DESYREL) 100 MG tablet TAKE ONE TABLET AT BEDTIME  30 tablet  11   No current facility-administered medications on file prior to visit.    Allergies  Allergen Reactions  . Morphine And Related     Past Medical History  Diagnosis Date  . Frontotemporal dementia   . Hyperlipidemia   . CVA (cerebral  infarction)   . Malnutrition   . Melanoma of back 1988    stage 3--removed and 6 month surveillance since then  . Agitation     Past Surgical History  Procedure Laterality Date  . Mri/mra  04/17/11    Normal at [facility]  . Melanoma excision    . Back surgery      twice on low back  . Corneal transplant      needed 3 seperate times    Family History  Problem Relation Age of Onset  . Cancer Mother     lung  . Cancer Father     lung  . Heart disease Father   . Diabetes Sister   . Heart disease Paternal Uncle     all 5 uncles died of MI    History   Social History  . Marital Status: Married    Spouse Name: N/A    Number of Children: 2  . Years of Education: N/A   Occupational History  . School Production designer, theatre/television/film     retired with illness   Social History Main Topics  . Smoking status: Former Games developer  . Smokeless tobacco: Never Used     Comment: quit pipe ~1996 (rare even before this though)  . Alcohol Use: No     Comment: Occ social use or daily beer till sick  .  Drug Use: Not on file  . Sexually Active: Not on file   Other Topics Concern  . Not on file   Social History Narrative   2nd marriage for both   1 daughter and 1 stepdaughter   Wife holds health care POA   Has DNR order   Does not want hospitalization   Never discussed tube feedings   Review of Systems Eats fairly well--keeps his eyes closed They have cut back to keep him from gaining weight---seems about the same Gets constipated at times--they give him prune juice daily and senekot Has occasional cough May choke if he swallows too fast. Regular liquids and food Chronic rash is some better    Objective:   Physical Exam  Constitutional: He appears well-nourished. No distress.  Neck: Normal range of motion. No thyromegaly present.  Cardiovascular: Normal rate, regular rhythm and normal heart sounds.  Exam reveals no gallop.   No murmur heard. Pulmonary/Chest: Effort normal and breath sounds normal.  No respiratory distress. He has no wheezes. He has no rales.  Abdominal: Soft. There is no tenderness.  Musculoskeletal: He exhibits no edema.  Lymphadenopathy:    He has no cervical adenopathy.  Neurological:  1 word answers  Doesn't really engage Mildly increased tone in all extremities Not much active movement  Psychiatric:  Not agitated now Hard to judge mood          Assessment & Plan:

## 2012-08-12 NOTE — Assessment & Plan Note (Signed)
Hasn't been as evident Off the pantoprazole---would restart if this recurs

## 2012-08-12 NOTE — Assessment & Plan Note (Signed)
Doing okay with trazodone Naps in day Seems to need overall more sleep daily with the progression of the dementia

## 2012-08-28 DIAGNOSIS — F0280 Dementia in other diseases classified elsewhere without behavioral disturbance: Secondary | ICD-10-CM | POA: Diagnosis not present

## 2012-08-28 DIAGNOSIS — I6990 Unspecified sequelae of unspecified cerebrovascular disease: Secondary | ICD-10-CM | POA: Diagnosis not present

## 2012-08-28 DIAGNOSIS — I679 Cerebrovascular disease, unspecified: Secondary | ICD-10-CM | POA: Diagnosis not present

## 2012-08-28 DIAGNOSIS — F05 Delirium due to known physiological condition: Secondary | ICD-10-CM | POA: Diagnosis not present

## 2012-09-28 DIAGNOSIS — I679 Cerebrovascular disease, unspecified: Secondary | ICD-10-CM | POA: Diagnosis not present

## 2012-09-28 DIAGNOSIS — I6990 Unspecified sequelae of unspecified cerebrovascular disease: Secondary | ICD-10-CM | POA: Diagnosis not present

## 2012-09-28 DIAGNOSIS — F05 Delirium due to known physiological condition: Secondary | ICD-10-CM | POA: Diagnosis not present

## 2012-09-28 DIAGNOSIS — F0280 Dementia in other diseases classified elsewhere without behavioral disturbance: Secondary | ICD-10-CM | POA: Diagnosis not present

## 2012-09-29 DIAGNOSIS — I6789 Other cerebrovascular disease: Secondary | ICD-10-CM

## 2012-09-29 DIAGNOSIS — R627 Adult failure to thrive: Secondary | ICD-10-CM | POA: Diagnosis not present

## 2012-10-17 DIAGNOSIS — F0280 Dementia in other diseases classified elsewhere without behavioral disturbance: Secondary | ICD-10-CM

## 2012-10-17 DIAGNOSIS — R410 Disorientation, unspecified: Secondary | ICD-10-CM

## 2012-10-17 DIAGNOSIS — G3109 Other frontotemporal dementia: Secondary | ICD-10-CM | POA: Diagnosis not present

## 2012-10-17 DIAGNOSIS — F0151 Vascular dementia with behavioral disturbance: Secondary | ICD-10-CM

## 2012-10-17 DIAGNOSIS — I679 Cerebrovascular disease, unspecified: Secondary | ICD-10-CM

## 2012-10-17 DIAGNOSIS — I6990 Unspecified sequelae of unspecified cerebrovascular disease: Secondary | ICD-10-CM

## 2012-10-21 ENCOUNTER — Other Ambulatory Visit: Payer: Self-pay | Admitting: Internal Medicine

## 2012-10-21 NOTE — Telephone Encounter (Signed)
rx sent to pharmacy by e-script  

## 2012-10-21 NOTE — Telephone Encounter (Signed)
Okay to fill for a year 

## 2012-10-28 DIAGNOSIS — F0280 Dementia in other diseases classified elsewhere without behavioral disturbance: Secondary | ICD-10-CM | POA: Diagnosis not present

## 2012-10-28 DIAGNOSIS — I6990 Unspecified sequelae of unspecified cerebrovascular disease: Secondary | ICD-10-CM | POA: Diagnosis not present

## 2012-10-28 DIAGNOSIS — F05 Delirium due to known physiological condition: Secondary | ICD-10-CM | POA: Diagnosis not present

## 2012-10-28 DIAGNOSIS — I679 Cerebrovascular disease, unspecified: Secondary | ICD-10-CM | POA: Diagnosis not present

## 2012-11-05 ENCOUNTER — Ambulatory Visit: Payer: Medicare Other | Admitting: Internal Medicine

## 2012-11-05 ENCOUNTER — Encounter: Payer: Self-pay | Admitting: Internal Medicine

## 2012-11-05 VITALS — BP 128/76 | HR 64 | Resp 18

## 2012-11-05 DIAGNOSIS — I639 Cerebral infarction, unspecified: Secondary | ICD-10-CM

## 2012-11-05 DIAGNOSIS — G3109 Other frontotemporal dementia: Secondary | ICD-10-CM | POA: Diagnosis not present

## 2012-11-05 DIAGNOSIS — G479 Sleep disorder, unspecified: Secondary | ICD-10-CM | POA: Diagnosis not present

## 2012-11-05 DIAGNOSIS — L219 Seborrheic dermatitis, unspecified: Secondary | ICD-10-CM

## 2012-11-05 DIAGNOSIS — IMO0002 Reserved for concepts with insufficient information to code with codable children: Secondary | ICD-10-CM | POA: Diagnosis not present

## 2012-11-05 DIAGNOSIS — F0280 Dementia in other diseases classified elsewhere without behavioral disturbance: Secondary | ICD-10-CM

## 2012-11-05 DIAGNOSIS — R451 Restlessness and agitation: Secondary | ICD-10-CM

## 2012-11-05 DIAGNOSIS — L719 Rosacea, unspecified: Secondary | ICD-10-CM

## 2012-11-05 DIAGNOSIS — I6350 Cerebral infarction due to unspecified occlusion or stenosis of unspecified cerebral artery: Secondary | ICD-10-CM

## 2012-11-05 MED ORDER — HYDROCORTISONE 2.5 % EX CREA: TOPICAL_CREAM | Freq: Three times a day (TID) | CUTANEOUS | Status: DC | PRN

## 2012-11-05 MED ORDER — METRONIDAZOLE 0.75 % EX GEL: Freq: Two times a day (BID) | CUTANEOUS | Status: DC

## 2012-11-05 NOTE — Assessment & Plan Note (Signed)
Sleeping fine but also much of the day Will try to wean and stop trazodone if possible

## 2012-11-05 NOTE — Assessment & Plan Note (Signed)
Does have typical vessel dilation and spiders on nose and slightly on cheeks Will try metrogel
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
## 2012-11-05 NOTE — Assessment & Plan Note (Signed)
Still has physical aggression--though mostly with bathing and cleaning Will continue pre bath alprazolam (may need to try the 0.5mg  dose) On nudexta and keppra for this also

## 2012-11-05 NOTE — Assessment & Plan Note (Signed)
Severe  Total care Still on hospice

## 2012-11-05 NOTE — Assessment & Plan Note (Signed)
No obvious separate disability from this

## 2012-11-05 NOTE — Assessment & Plan Note (Signed)
Discussed using dandruff shampoo Okay to use triamcinolone that they already have on that Otherwise 2.5% hydrocortisone cream

## 2012-11-10 ENCOUNTER — Other Ambulatory Visit: Payer: Self-pay | Admitting: *Deleted

## 2012-11-10 DIAGNOSIS — R410 Disorientation, unspecified: Secondary | ICD-10-CM

## 2012-11-10 DIAGNOSIS — F0151 Vascular dementia with behavioral disturbance: Secondary | ICD-10-CM

## 2012-11-10 DIAGNOSIS — I679 Cerebrovascular disease, unspecified: Secondary | ICD-10-CM | POA: Diagnosis not present

## 2012-11-10 DIAGNOSIS — G3109 Other frontotemporal dementia: Secondary | ICD-10-CM

## 2012-11-10 DIAGNOSIS — I6990 Unspecified sequelae of unspecified cerebrovascular disease: Secondary | ICD-10-CM | POA: Diagnosis not present

## 2012-11-10 DIAGNOSIS — F0280 Dementia in other diseases classified elsewhere without behavioral disturbance: Secondary | ICD-10-CM | POA: Diagnosis not present

## 2012-11-10 MED ORDER — SENNOSIDES-DOCUSATE SODIUM 8.6-50 MG PO TABS: 1.0000 | ORAL_TABLET | Freq: Two times a day (BID) | ORAL | Status: DC

## 2012-11-10 NOTE — Telephone Encounter (Signed)
Received faxed refill request from pharmacy. Refill sent to pharmacy electronically. 

## 2012-11-20 ENCOUNTER — Other Ambulatory Visit: Payer: Self-pay | Admitting: *Deleted

## 2012-11-20 MED ORDER — LEVETIRACETAM 500 MG PO TABS: 500.0000 mg | ORAL_TABLET | Freq: Two times a day (BID) | ORAL | Status: DC

## 2012-11-20 MED ORDER — ALPRAZOLAM 0.25 MG PO TABS: 0.2500 mg | ORAL_TABLET | Freq: Three times a day (TID) | ORAL | Status: DC | PRN

## 2012-11-20 NOTE — Telephone Encounter (Signed)
rx called into pharmacy rx sent to pharmacy by e-script  

## 2012-11-20 NOTE — Telephone Encounter (Signed)
The instructions on the faxed request are different than what we have in his chart. The faxed request states ALPRAZOLAM 0.25 MG take 1-2 tablets twice a day prior to morning and evening care and may take 1 tab three times a day as needed. Please advise on correct instructions for ALPRAZOLAM

## 2012-11-20 NOTE — Telephone Encounter (Signed)
Keppra for a year  Alprazolam orders were updated at recent visit and are correct Okay #90 x 0

## 2012-11-28 DIAGNOSIS — I6990 Unspecified sequelae of unspecified cerebrovascular disease: Secondary | ICD-10-CM | POA: Diagnosis not present

## 2012-11-28 DIAGNOSIS — F0280 Dementia in other diseases classified elsewhere without behavioral disturbance: Secondary | ICD-10-CM | POA: Diagnosis not present

## 2012-12-29 DIAGNOSIS — F0280 Dementia in other diseases classified elsewhere without behavioral disturbance: Secondary | ICD-10-CM | POA: Diagnosis not present

## 2012-12-29 DIAGNOSIS — I6990 Unspecified sequelae of unspecified cerebrovascular disease: Secondary | ICD-10-CM | POA: Diagnosis not present

## 2013-01-14 DIAGNOSIS — I679 Cerebrovascular disease, unspecified: Secondary | ICD-10-CM

## 2013-01-14 DIAGNOSIS — F0280 Dementia in other diseases classified elsewhere without behavioral disturbance: Secondary | ICD-10-CM

## 2013-01-14 DIAGNOSIS — R410 Disorientation, unspecified: Secondary | ICD-10-CM

## 2013-01-14 DIAGNOSIS — G3109 Other frontotemporal dementia: Secondary | ICD-10-CM

## 2013-01-14 DIAGNOSIS — I6990 Unspecified sequelae of unspecified cerebrovascular disease: Secondary | ICD-10-CM | POA: Diagnosis not present

## 2013-01-14 DIAGNOSIS — F0151 Vascular dementia with behavioral disturbance: Secondary | ICD-10-CM

## 2013-01-22 ENCOUNTER — Encounter: Payer: Self-pay | Admitting: Internal Medicine

## 2013-01-22 ENCOUNTER — Ambulatory Visit: Payer: Medicare Other | Admitting: Internal Medicine

## 2013-01-22 VITALS — BP 128/76 | HR 66 | Resp 14

## 2013-01-22 DIAGNOSIS — G479 Sleep disorder, unspecified: Secondary | ICD-10-CM | POA: Diagnosis not present

## 2013-01-22 DIAGNOSIS — F0280 Dementia in other diseases classified elsewhere without behavioral disturbance: Secondary | ICD-10-CM

## 2013-01-22 DIAGNOSIS — Z23 Encounter for immunization: Secondary | ICD-10-CM

## 2013-01-22 DIAGNOSIS — R451 Restlessness and agitation: Secondary | ICD-10-CM

## 2013-01-22 DIAGNOSIS — L219 Seborrheic dermatitis, unspecified: Secondary | ICD-10-CM

## 2013-01-22 DIAGNOSIS — IMO0002 Reserved for concepts with insufficient information to code with codable children: Secondary | ICD-10-CM

## 2013-01-22 NOTE — Progress Notes (Signed)
Subjective:    Patient ID: Tanner Rosario, male    DOB: 04-29-1946, 67 y.o.   MRN: [id number]  HPI Wife and caregiver are here  He has been fairly stable Still not particularly communicative --no understandable speech Requires total care---lift for transfers Needs to be fed  Still agitated at times--mostly with care Gets the alprazolam before twice a week baths from hospice aides Will be aggressive when being changed also  Sleeping okay Did cut the trazodone in half Doesn't seem to be sedated in AM----though he does go back to sleep multiple times in the day  Still gets rash along hips Wife concerned that she can't turn him independently---though she can change him May just be heat rash  Face is much better Using coconut oil and hydrocortisone---the metronidazole was too expensive Tends to happen after shaving  Current Outpatient Prescriptions on File Prior to Visit  Medication Sig Dispense Refill  . ALPRAZolam (XANAX) 0.25 MG tablet Take 1-2 tablets (0.25-0.5 mg total) by mouth 3 (three) times daily as needed. Before baths regularly  90 tablet  0  . cholecalciferol (VITAMIN D) 1000 UNITS tablet Take 1,000 Units by mouth daily.      . hydrocortisone 2.5 % cream Apply topically 3 (three) times daily as needed.  30 g  5  . Infant Care Products (DERMACLOUD) CREA Apply topically.      . levETIRAcetam (KEPPRA) 500 MG tablet Take 1 tablet (500 mg total) by mouth 2 (two) times daily.  60 tablet  11  . metroNIDAZOLE (METROGEL) 0.75 % gel Apply topically 2 (two) times daily.  45 g  3  . NAMENDA 10 MG tablet TAKE ONE TABLET TWICE A DAY  60 tablet  11  . NUEDEXTA 20-10 MG CAPS TAKE ONE CAPSULE TWICE A DAY  60 capsule  11  . senna-docusate (SENOKOT-S) 8.6-50 MG per tablet Take 1 tablet by mouth 2 (two) times daily.  100 tablet  1  . traZODone (DESYREL) 100 MG tablet Take 50 mg by mouth at bedtime.        No current facility-administered medications on file prior to visit.     Allergies  Allergen Reactions  . Morphine And Related     Past Medical History  Diagnosis Date  . Frontotemporal dementia   . Hyperlipidemia   . CVA (cerebral infarction)   . Malnutrition   . Melanoma of back 1988    stage 3--removed and 6 month surveillance since then  . Agitation     Past Surgical History  Procedure Laterality Date  . Mri/mra  04/17/11    Normal at [facility]  . Melanoma excision    . Back surgery      twice on low back  . Corneal transplant      needed 3 seperate times    Family History  Problem Relation Age of Onset  . Cancer Mother     lung  . Cancer Father     lung  . Heart disease Father   . Diabetes Sister   . Heart disease Paternal Uncle     all 5 uncles died of MI    History   Social History  . Marital Status: Married    Spouse Name: N/A    Number of Children: 2  . Years of Education: N/A   Occupational History  . School Production designer, theatre/television/film     retired with illness   Social History Main Topics  . Smoking status: Former Games developer  . Smokeless tobacco: Never  Used     Comment: quit pipe ~1996 (rare even before this though)  . Alcohol Use: No     Comment: Occ social use or daily beer till sick  . Drug Use: Not on file  . Sexual Activity: Not on file   Other Topics Concern  . Not on file   Social History Narrative   2nd marriage for both   1 daughter and 1 stepdaughter   Wife holds health care POA   Has DNR order   Does not want hospitalization   Never discussed tube feedings   Review of Systems Eating is still fine. Wife still moderating diet Weight seems to be stable Bowels okay with senna No cough or breathing problems    Objective:   Physical Exam  Constitutional: He appears well-nourished. No distress.  Neck: No thyromegaly present.  Slight redness over left parotid but not enlarged or inflamed  Cardiovascular: Normal rate, regular rhythm and normal heart sounds.  Exam reveals no gallop.   No murmur  heard. Pulmonary/Chest: Effort normal and breath sounds normal. No respiratory distress. He has no wheezes. He has no rales.  Abdominal: Soft. There is no tenderness.  Musculoskeletal: He exhibits no edema.  Lymphadenopathy:    He has no cervical adenopathy.  Neurological:  Doesn't speak or really interact---though seems to look at me Increased tone UE>LE No focal weakness  Skin:  Very slight redness along right flank (?heat rash)  Psychiatric:  Calm now          Assessment & Plan:

## 2013-01-22 NOTE — Addendum Note (Signed)
Addended by: Sueanne Margarita on: 01/22/2013 01:10 PM   Modules accepted: Orders

## 2013-01-22 NOTE — Assessment & Plan Note (Signed)
Persists mostly with personal care keppra did seem to help Has the alprazolam for prn

## 2013-01-22 NOTE — Assessment & Plan Note (Signed)
Facial rash is better

## 2013-01-22 NOTE — Assessment & Plan Note (Signed)
Severe On hospice  Total care and incontinent No speech for the most part  Discussed trying off the namenda---wife will try going down to 10mg  daily for a while and then stop if no apparent change

## 2013-01-22 NOTE — Assessment & Plan Note (Signed)
Seems to be doing okay with the trazodone Not really sedated in AM on the lower dose, though seems to sleep on and off during the day as his baseline (due to the severity of the dementia)

## 2013-01-28 DIAGNOSIS — F0280 Dementia in other diseases classified elsewhere without behavioral disturbance: Secondary | ICD-10-CM | POA: Diagnosis not present

## 2013-01-28 DIAGNOSIS — I679 Cerebrovascular disease, unspecified: Secondary | ICD-10-CM | POA: Diagnosis not present

## 2013-01-28 DIAGNOSIS — I6990 Unspecified sequelae of unspecified cerebrovascular disease: Secondary | ICD-10-CM | POA: Diagnosis not present

## 2013-02-28 DIAGNOSIS — F0280 Dementia in other diseases classified elsewhere without behavioral disturbance: Secondary | ICD-10-CM | POA: Diagnosis not present

## 2013-02-28 DIAGNOSIS — I6990 Unspecified sequelae of unspecified cerebrovascular disease: Secondary | ICD-10-CM | POA: Diagnosis not present

## 2013-02-28 DIAGNOSIS — I679 Cerebrovascular disease, unspecified: Secondary | ICD-10-CM | POA: Diagnosis not present

## 2013-03-03 DIAGNOSIS — F0280 Dementia in other diseases classified elsewhere without behavioral disturbance: Secondary | ICD-10-CM | POA: Diagnosis not present

## 2013-03-03 DIAGNOSIS — F0151 Vascular dementia with behavioral disturbance: Secondary | ICD-10-CM

## 2013-03-03 DIAGNOSIS — I6990 Unspecified sequelae of unspecified cerebrovascular disease: Secondary | ICD-10-CM

## 2013-03-03 DIAGNOSIS — G3109 Other frontotemporal dementia: Secondary | ICD-10-CM

## 2013-03-03 DIAGNOSIS — R410 Disorientation, unspecified: Secondary | ICD-10-CM

## 2013-03-03 DIAGNOSIS — I679 Cerebrovascular disease, unspecified: Secondary | ICD-10-CM | POA: Diagnosis not present

## 2013-03-30 DIAGNOSIS — I679 Cerebrovascular disease, unspecified: Secondary | ICD-10-CM | POA: Diagnosis not present

## 2013-03-30 DIAGNOSIS — F0280 Dementia in other diseases classified elsewhere without behavioral disturbance: Secondary | ICD-10-CM | POA: Diagnosis not present

## 2013-03-30 DIAGNOSIS — I6990 Unspecified sequelae of unspecified cerebrovascular disease: Secondary | ICD-10-CM | POA: Diagnosis not present

## 2013-04-03 ENCOUNTER — Telehealth: Payer: Self-pay | Admitting: Family Medicine

## 2013-04-03 NOTE — Telephone Encounter (Signed)
Call from Hospice : pt has nasal congestion that is making him agitated  He is not on oxygen and denies fever or other symptoms   Recommended to try afrin for the short term - 2 sprays in each nostril up to every 12 hours as needed Also mucinex plain otc prn congestion Will update if no improvement or worse/ new symptoms

## 2013-04-04 NOTE — Telephone Encounter (Signed)
Please check on him on Monday 

## 2013-04-06 NOTE — Telephone Encounter (Signed)
Spoke to [place] (caregiver) and was advised that patient is doing better. Steward Drone stated that she will have his wife care back if patient gets worse.

## 2013-04-06 NOTE — Telephone Encounter (Signed)
Good to hear

## 2013-04-15 ENCOUNTER — Encounter: Payer: Self-pay | Admitting: Internal Medicine

## 2013-04-15 ENCOUNTER — Ambulatory Visit: Payer: Medicare Other | Admitting: Internal Medicine

## 2013-04-15 VITALS — BP 114/58 | HR 65 | Resp 21

## 2013-04-15 DIAGNOSIS — F482 Pseudobulbar affect: Secondary | ICD-10-CM | POA: Diagnosis not present

## 2013-04-15 DIAGNOSIS — IMO0002 Reserved for concepts with insufficient information to code with codable children: Secondary | ICD-10-CM | POA: Diagnosis not present

## 2013-04-15 DIAGNOSIS — R451 Restlessness and agitation: Secondary | ICD-10-CM

## 2013-04-15 DIAGNOSIS — G479 Sleep disorder, unspecified: Secondary | ICD-10-CM

## 2013-04-15 DIAGNOSIS — F0280 Dementia in other diseases classified elsewhere without behavioral disturbance: Secondary | ICD-10-CM

## 2013-04-15 MED ORDER — MEMANTINE HCL 10 MG PO TABS: 10.0000 mg | ORAL_TABLET | Freq: Two times a day (BID) | ORAL | Status: DC

## 2013-04-15 NOTE — Assessment & Plan Note (Signed)
May not need the trazodone Will have trial without after dealing with the namenda

## 2013-04-15 NOTE — Assessment & Plan Note (Signed)
Severe Still on hospice care

## 2013-04-15 NOTE — Assessment & Plan Note (Signed)
Seems to be worse off the namenda Will try adding it back 10 daily, then bid if needed  Not sure if he still needs the trazodone---will try off that once we decide what we are going to do with the namenda

## 2013-04-15 NOTE — Progress Notes (Signed)
Subjective:    Patient ID: Tanner Rosario, male    DOB: 17-Nov-1945, 67 y.o.   MRN: [id number]  HPI Wife and caregiver are here [name]--hospice RN is here Did get recertified for hospice  Has had repeated choking and coughing spells with fluids--4 times in past 2 weeks Regular food has not been a problem Some coughing at night--but doesn't seem to be choking on saliva Uses glass with thin rim--now starting to use sippy cup Eats and drinks in chair  No fevers No fast or labored breathing  Still only transfers with lift Up once--for breakfast and then nap in afternoon Usually up for about 4 hours in evening Caregivers for 16 hours (wife alone with him at night) Only says "no" and very sporadic words---just jabbers some  Still gets agitated with baths Pushes and swings Occasionally will hit wife with fist--when she is trying to change him Gets alprazolam before bath with hospice aide Wife did wean him off the namenda---had 3 really bad days and then settled in Still seems to be more aggressive since then--will hit more frequently  Current Outpatient Prescriptions on File Prior to Visit  Medication Sig Dispense Refill  . ALPRAZolam (XANAX) 0.25 MG tablet Take 1-2 tablets (0.25-0.5 mg total) by mouth 3 (three) times daily as needed. Before baths regularly  90 tablet  0  . cholecalciferol (VITAMIN D) 1000 UNITS tablet Take 1,000 Units by mouth daily.      . hydrocortisone 2.5 % cream Apply topically 3 (three) times daily as needed.  30 g  5  . Infant Care Products (DERMACLOUD) CREA Apply topically.      . levETIRAcetam (KEPPRA) 500 MG tablet Take 1 tablet (500 mg total) by mouth 2 (two) times daily.  60 tablet  11  . metroNIDAZOLE (METROGEL) 0.75 % gel Apply topically 2 (two) times daily.  45 g  3  . NUEDEXTA 20-10 MG CAPS TAKE ONE CAPSULE TWICE A DAY  60 capsule  11  . senna-docusate (SENOKOT-S) 8.6-50 MG per tablet Take 1 tablet by mouth 2 (two) times daily.  100 tablet  1  .  traZODone (DESYREL) 100 MG tablet Take 50 mg by mouth at bedtime.        No current facility-administered medications on file prior to visit.    Allergies  Allergen Reactions  . Morphine And Related     Past Medical History  Diagnosis Date  . Frontotemporal dementia   . Hyperlipidemia   . CVA (cerebral infarction)   . Malnutrition   . Melanoma of back 1988    stage 3--removed and 6 month surveillance since then  . Agitation     Past Surgical History  Procedure Laterality Date  . Mri/mra  04/17/11    Normal at [facility]  . Melanoma excision    . Back surgery      twice on low back  . Corneal transplant      needed 3 seperate times    Family History  Problem Relation Age of Onset  . Cancer Mother     lung  . Cancer Father     lung  . Heart disease Father   . Diabetes Sister   . Heart disease Paternal Uncle     all 5 uncles died of MI    History   Social History  . Marital Status: Married    Spouse Name: N/A    Number of Children: 2  . Years of Education: N/A   Occupational History  .  School Production designer, theatre/television/film     retired with illness   Social History Main Topics  . Smoking status: Former Games developer  . Smokeless tobacco: Never Used     Comment: quit pipe ~1996 (rare even before this though)  . Alcohol Use: No     Comment: Occ social use or daily beer till sick  . Drug Use: Not on file  . Sexual Activity: Not on file   Other Topics Concern  . Not on file   Social History Narrative   2nd marriage for both   1 daughter and 1 stepdaughter   Wife holds health care POA   Has DNR order   Does not want hospitalization   Never discussed tube feedings   Review of Systems Sleeps 18-20 hours per day They try to stimulate him when he is up--but he will "shut down" at times Does sleep more on days he has gotten the alprazolam Appetite is still good Weight seems stable Bowels are fine Voids okay but seems to be uncomfortable---shakes and occasional moans Rash is  under control with powder--- still has heat related rash at times     Objective:   Physical Exam  Constitutional: He appears well-nourished. No distress.  Neck: No thyromegaly present.  Cardiovascular: Normal rate, regular rhythm and normal heart sounds.  Exam reveals no gallop.   No murmur heard. Pulmonary/Chest: Effort normal and breath sounds normal. No respiratory distress. He has no wheezes. He has no rales.  Musculoskeletal: He exhibits no edema.  Lymphadenopathy:    He has no cervical adenopathy.  Neurological:  Increased tone in LE>UE Arms are strong--pushing me away and attempting to hit  Skin: No erythema.  No skin breakdown  Psychiatric:  Sleeping but arouses annoyed to voice Verbalizes slightly (mostly "damn") at my exam          Assessment & Plan:

## 2013-04-15 NOTE — Assessment & Plan Note (Signed)
Not clear if this or just agitation from dementia The nudextra does help some (stopping exacerbated his agitation)

## 2013-04-28 ENCOUNTER — Other Ambulatory Visit: Payer: Self-pay | Admitting: Internal Medicine

## 2013-04-28 NOTE — Telephone Encounter (Signed)
rx called into pharmacy

## 2013-04-28 NOTE — Telephone Encounter (Signed)
Last filled 11/20/12

## 2013-04-28 NOTE — Telephone Encounter (Signed)
Okay #90 x 0 

## 2013-04-30 DIAGNOSIS — I6990 Unspecified sequelae of unspecified cerebrovascular disease: Secondary | ICD-10-CM | POA: Diagnosis not present

## 2013-04-30 DIAGNOSIS — F0280 Dementia in other diseases classified elsewhere without behavioral disturbance: Secondary | ICD-10-CM | POA: Diagnosis not present

## 2013-04-30 DIAGNOSIS — G3109 Other frontotemporal dementia: Secondary | ICD-10-CM | POA: Diagnosis not present

## 2013-04-30 DIAGNOSIS — I679 Cerebrovascular disease, unspecified: Secondary | ICD-10-CM | POA: Diagnosis not present

## 2013-05-06 DIAGNOSIS — G3109 Other frontotemporal dementia: Secondary | ICD-10-CM

## 2013-05-06 DIAGNOSIS — I6990 Unspecified sequelae of unspecified cerebrovascular disease: Secondary | ICD-10-CM

## 2013-05-06 DIAGNOSIS — I679 Cerebrovascular disease, unspecified: Secondary | ICD-10-CM

## 2013-05-06 DIAGNOSIS — F0280 Dementia in other diseases classified elsewhere without behavioral disturbance: Secondary | ICD-10-CM

## 2013-05-11 IMAGING — CT CT HEAD WITHOUT CONTRAST
1 of 2 series · 13 of 30 positions shown, 17 images · non-contrast
Comparison: none

REASON FOR EXAM: Bryan
COMMENTS:

PROCEDURE:     CT  - CT HEAD WITHOUT CONTRAST  - July 05, 2011 [DATE]
RESULT:     Comparison:  06/18/2011
TECHNIQUE: Multiple axial images from the foramen magnum to the vertex were
obtained without IV contrast.

[Series 2: without · axial · non-contrast · 0.43mm/px · z∈[+144,+274]mm · 13 of 32 slices shown, 17 images]
[im 3/32  brain]
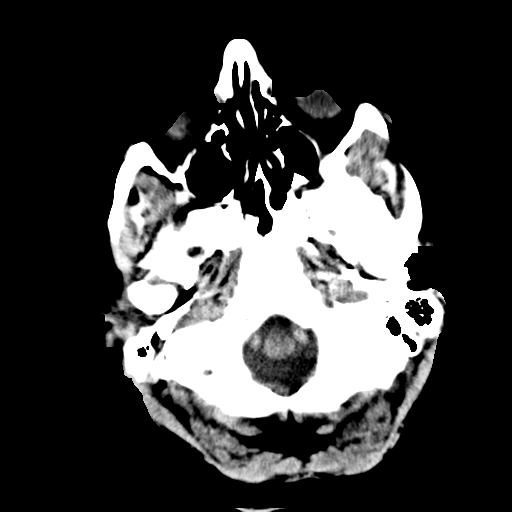
[im 3/32  bone]
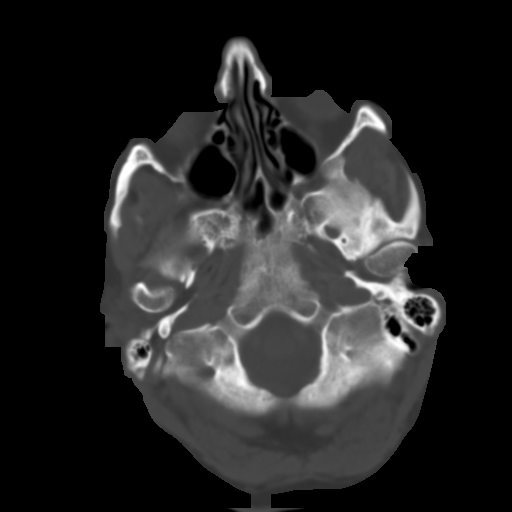
[im 5/32  brain]
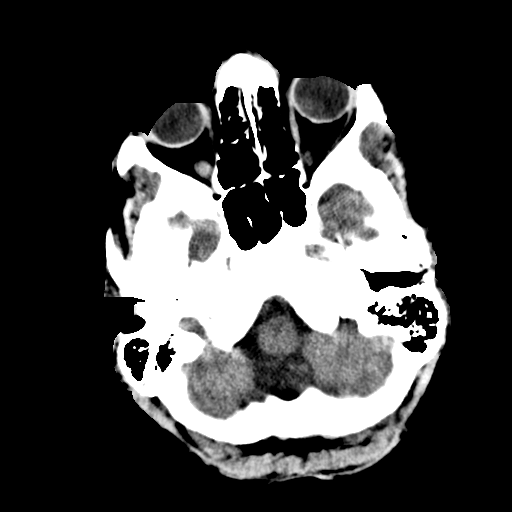
[im 7/32  brain]
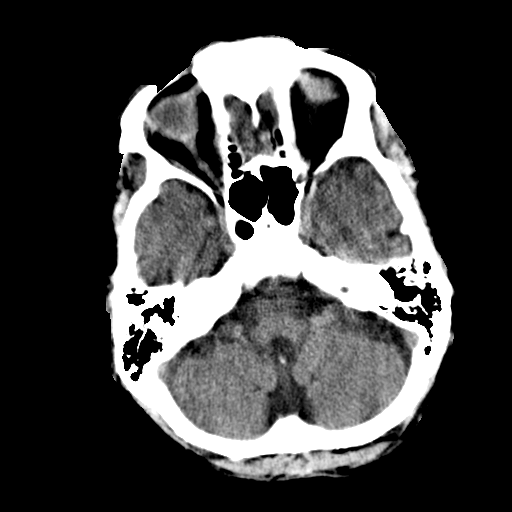
[im 9/32  brain]
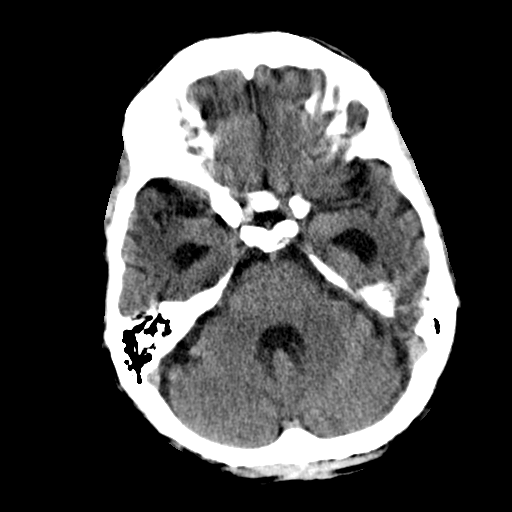
[im 12/32  brain]
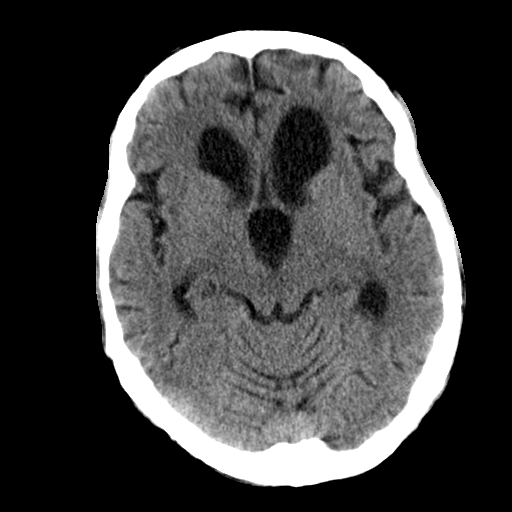
[im 12/32  bone]
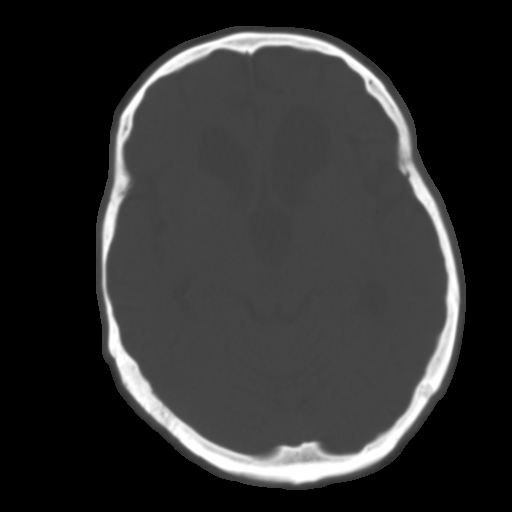
[im 14/32  brain]
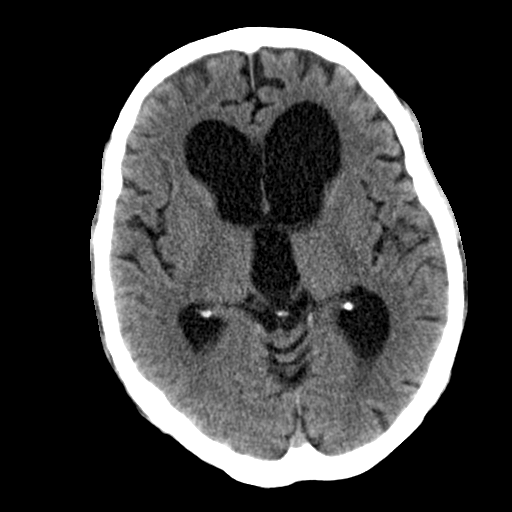
[im 16/32  brain]
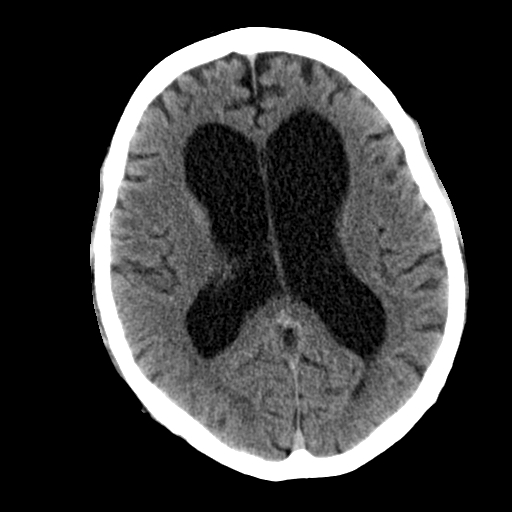
[im 18/32  brain]
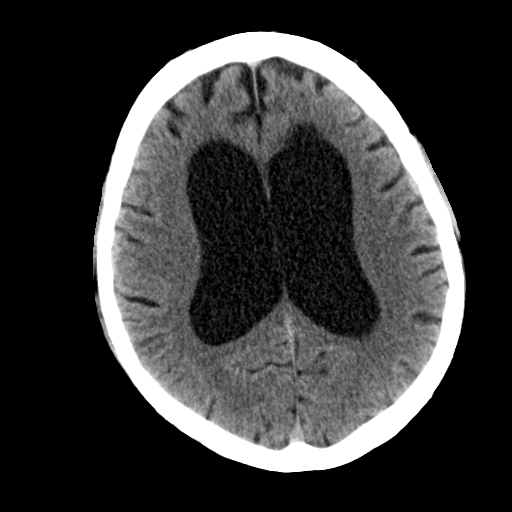
[im 20/32  brain]
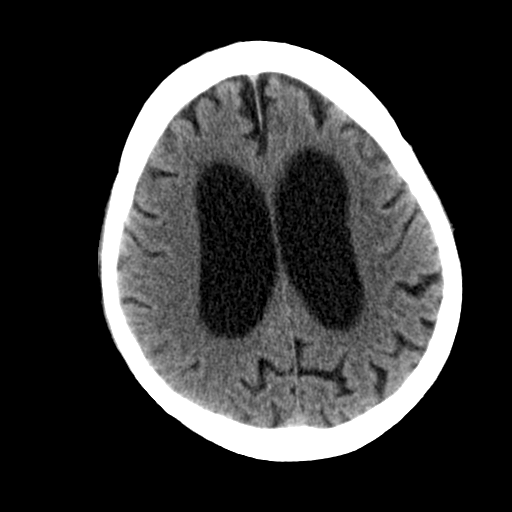
[im 20/32  bone]
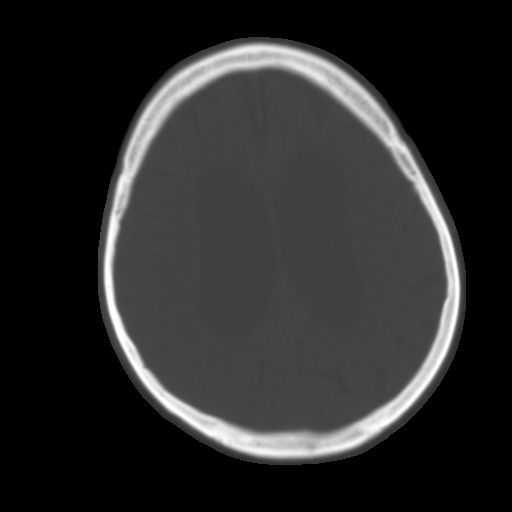
[im 23/32  brain]
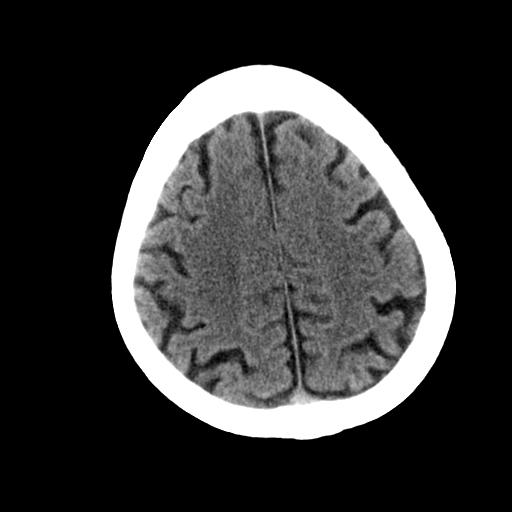
[im 25/32  brain]
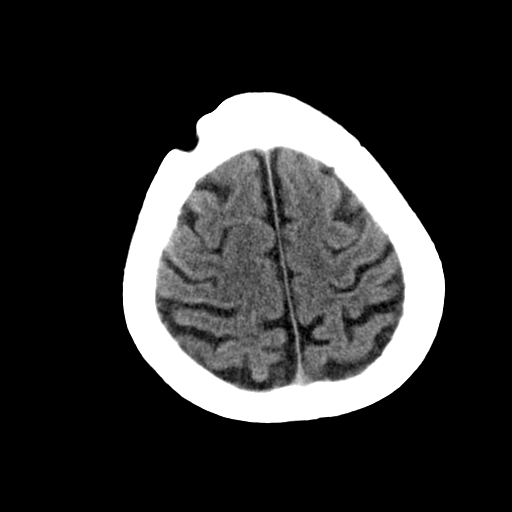
[im 27/32  brain]
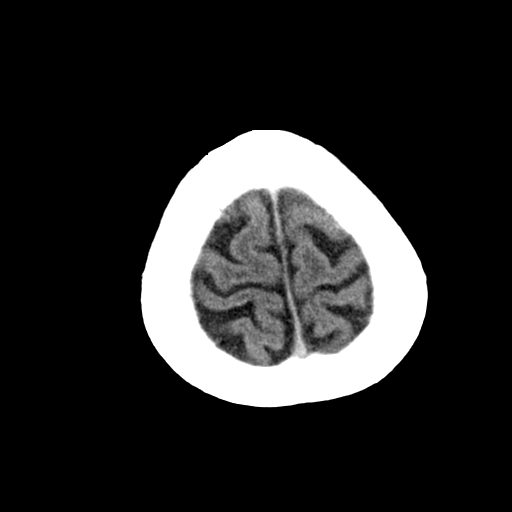
[im 29/32  brain]
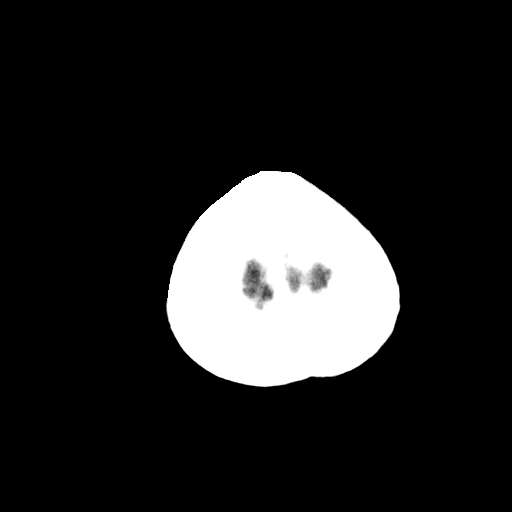
[im 29/32  bone]
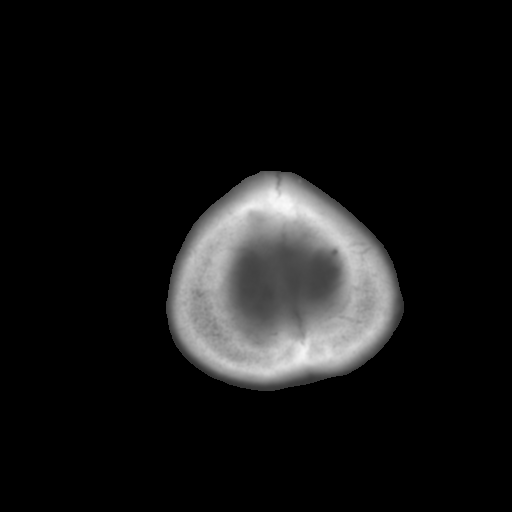

[13 of 30 positions shown; findings below may reference images not displayed]

FINDINGS: There is no evidence of mass effect, midline shift, or extra-axial fluid
collections.  There is no evidence of a space-occupying lesion or
intracranial hemorrhage. There is no evidence of a cortical-based area of
acute infarction. There is generalized cerebral atrophy. There is
periventricular white matter low attenuation likely secondary to
microangiopathy.

There is ventriculomegaly with dilatation of the bifrontal diameter
measuring 6.9 cm which is similar to the prior examination. This appearance
can be seen with normal pressure hydrocephalus.

Visualized portions of the orbits are unremarkable. The visualized portions
of the paranasal sinuses and mastoid air cells are unremarkable.

The osseous structures are unremarkable.
IMPRESSION: No acute intracranial process.

There is ventriculomegaly which is similar to the prior examination. This
appearance can be seen with normal pressure hydrocephalus. A neurology
consultation recommended.

## 2013-05-19 ENCOUNTER — Other Ambulatory Visit: Payer: Self-pay | Admitting: Internal Medicine

## 2013-05-19 NOTE — Telephone Encounter (Signed)
rx sent to pharmacy by e-script  

## 2013-05-19 NOTE — Telephone Encounter (Signed)
Okay to fill for a year

## 2013-05-19 NOTE — Telephone Encounter (Signed)
Ok to fill 

## 2013-05-31 DIAGNOSIS — G3109 Other frontotemporal dementia: Secondary | ICD-10-CM | POA: Diagnosis not present

## 2013-05-31 DIAGNOSIS — I679 Cerebrovascular disease, unspecified: Secondary | ICD-10-CM | POA: Diagnosis not present

## 2013-05-31 DIAGNOSIS — F0280 Dementia in other diseases classified elsewhere without behavioral disturbance: Secondary | ICD-10-CM | POA: Diagnosis not present

## 2013-05-31 DIAGNOSIS — I6990 Unspecified sequelae of unspecified cerebrovascular disease: Secondary | ICD-10-CM | POA: Diagnosis not present

## 2013-06-01 ENCOUNTER — Other Ambulatory Visit: Payer: Self-pay | Admitting: Internal Medicine

## 2013-06-19 ENCOUNTER — Other Ambulatory Visit: Payer: Self-pay | Admitting: Internal Medicine

## 2013-06-19 NOTE — Telephone Encounter (Signed)
Ok to fill 

## 2013-06-19 NOTE — Telephone Encounter (Signed)
Okay for a year

## 2013-06-28 DIAGNOSIS — G3109 Other frontotemporal dementia: Secondary | ICD-10-CM | POA: Diagnosis not present

## 2013-06-28 DIAGNOSIS — I6990 Unspecified sequelae of unspecified cerebrovascular disease: Secondary | ICD-10-CM | POA: Diagnosis not present

## 2013-06-28 DIAGNOSIS — I679 Cerebrovascular disease, unspecified: Secondary | ICD-10-CM | POA: Diagnosis not present

## 2013-06-28 DIAGNOSIS — F0280 Dementia in other diseases classified elsewhere without behavioral disturbance: Secondary | ICD-10-CM | POA: Diagnosis not present

## 2013-07-02 ENCOUNTER — Telehealth: Payer: Self-pay | Admitting: *Deleted

## 2013-07-02 NOTE — Telephone Encounter (Signed)
Jenny Reichmann calling stating this is day 2 of pt have a fever, his left eye is swollen and has a pus discharge, she would like either a antibiotic or ointment for his eye. Please advise

## 2013-07-02 NOTE — Telephone Encounter (Signed)
Discussed with her She is on the way there Has systemic symptoms per the wife  Will plan to treat with augmentin ES 7.5cc bid for 10 days If she thinks he is sicker than expected, she will call me back

## 2013-07-03 DIAGNOSIS — I6990 Unspecified sequelae of unspecified cerebrovascular disease: Secondary | ICD-10-CM | POA: Diagnosis not present

## 2013-07-03 DIAGNOSIS — G3109 Other frontotemporal dementia: Secondary | ICD-10-CM

## 2013-07-03 DIAGNOSIS — F0280 Dementia in other diseases classified elsewhere without behavioral disturbance: Secondary | ICD-10-CM | POA: Diagnosis not present

## 2013-07-03 DIAGNOSIS — I679 Cerebrovascular disease, unspecified: Secondary | ICD-10-CM

## 2013-07-22 ENCOUNTER — Ambulatory Visit: Admitting: Internal Medicine

## 2013-07-22 ENCOUNTER — Encounter: Payer: Self-pay | Admitting: Internal Medicine

## 2013-07-22 VITALS — BP 116/90 | HR 58 | Resp 14

## 2013-07-22 DIAGNOSIS — G479 Sleep disorder, unspecified: Secondary | ICD-10-CM

## 2013-07-22 DIAGNOSIS — G3109 Other frontotemporal dementia: Principal | ICD-10-CM

## 2013-07-22 DIAGNOSIS — F482 Pseudobulbar affect: Secondary | ICD-10-CM

## 2013-07-22 DIAGNOSIS — F0280 Dementia in other diseases classified elsewhere without behavioral disturbance: Secondary | ICD-10-CM

## 2013-07-22 DIAGNOSIS — IMO0002 Reserved for concepts with insufficient information to code with codable children: Secondary | ICD-10-CM | POA: Diagnosis not present

## 2013-07-22 DIAGNOSIS — R451 Restlessness and agitation: Secondary | ICD-10-CM

## 2013-07-22 DIAGNOSIS — L219 Seborrheic dermatitis, unspecified: Secondary | ICD-10-CM

## 2013-07-22 MED ORDER — KETOCONAZOLE 2 % EX SHAM: 1.0000 "application " | MEDICATED_SHAMPOO | CUTANEOUS | Status: DC

## 2013-07-22 NOTE — Assessment & Plan Note (Signed)
Will add ketoconazole shampoo

## 2013-07-22 NOTE — Assessment & Plan Note (Signed)
Sleeping most of the time Will try weaning off the trazodone --- 50mg  for 4 nights then stop (restart if he doesn't sleep at night which is vital for wife since she is alone with him)

## 2013-07-22 NOTE — Assessment & Plan Note (Signed)
Still regular problems with physical aggression--mostly with care Discussed using the pads in the diapers regularly to reduce the amount of total diaper changes Still gets the alprazolam for his baths in the morning

## 2013-07-22 NOTE — Assessment & Plan Note (Signed)
End stage Total care On hospice

## 2013-07-22 NOTE — Progress Notes (Signed)
Subjective:    Patient ID: Tanner Rosario, male    DOB: [birth year], 68 y.o.   MRN: [id number]  HPI Wife and aides here Wife still has from [place] to [place] She is alone with him at night  Now seems to sleep all the time-- eyes open only 2 hours per day Hard to tell if he is up more--probably--but keeps his eyes closed Discussed weaning off the trazodone--- 50mg  x 4 nights and then stop  Still eats okay-- needs to be fed Eats regular food Thin liquids--only chokes if he tries to swallow too quickly  Hospice aides come to bathe him twice a week Still has flaking scalp and facial rash  Still gets agitated at times Mostly with personal care---like changing his diaper Incontinent still Gets out of bed with lift-- usually by 10AM---has breakfast.  Stays in recliner till 3PM Then up 7PM for dinner and back 2-3 hours later  Current Outpatient Prescriptions on File Prior to Visit  Medication Sig Dispense Refill  . cholecalciferol (VITAMIN D) 1000 UNITS tablet Take 1,000 Units by mouth daily.      . hydrocortisone 2.5 % cream Apply topically 3 (three) times daily as needed.  30 g  5  . Infant Care Products (DERMACLOUD) CREA USE AS DIRECTED  430 g  1  . levETIRAcetam (KEPPRA) 500 MG tablet TAKE ONE TABLET TWICE A DAY  30 tablet  11  . metroNIDAZOLE (METROGEL) 0.75 % gel Apply topically 2 (two) times daily.  45 g  3  . NUEDEXTA 20-10 MG CAPS TAKE ONE CAPSULE TWICE A DAY  60 capsule  11  . senna-docusate (SENOKOT-S) 8.6-50 MG per tablet Take 1 tablet by mouth 2 (two) times daily.  100 tablet  1   No current facility-administered medications on file prior to visit.    Allergies  Allergen Reactions  . Morphine And Related     Past Medical History  Diagnosis Date  . Frontotemporal dementia   . Hyperlipidemia   . CVA (cerebral infarction)   . Malnutrition   . Melanoma of back 1988    stage 3--removed and 6 month surveillance since then  . Agitation     Past Surgical History    Procedure Laterality Date  . Mri/mra  04/17/11    Normal at [facility]  . Melanoma excision    . Back surgery      twice on low back  . Corneal transplant      needed 3 seperate times    Family History  Problem Relation Age of Onset  . Cancer Mother     lung  . Cancer Father     lung  . Heart disease Father   . Diabetes Sister   . Heart disease Paternal Uncle     all 5 uncles died of MI    History   Social History  . Marital Status: Married    Spouse Name: N/A    Number of Children: 2  . Years of Education: N/A   Occupational History  . School Scientist, physiological     retired with illness   Social History Main Topics  . Smoking status: Former Research scientist (life sciences)  . Smokeless tobacco: Never Used     Comment: quit pipe ~1996 (rare even before this though)  . Alcohol Use: No     Comment: Occ social use or daily beer till sick  . Drug Use: Not on file  . Sexual Activity: Not on file   Other Topics Concern  .  Not on file   Social History Narrative   2nd marriage for both   1 daughter and 1 stepdaughter   Wife holds health care POA   Has DNR order   Does not want hospitalization   Never discussed tube feedings   Review of Systems No ulcers--but gets red along side he lies on at night (or more of the time) Weight seems about the same No breathing problems Some cough--had redness in eye (?sinus) in early March. Wife thinks it may have been irritation from past surgery    Objective:   Physical Exam  Constitutional: He appears well-developed.  sleeping  Neck: No thyromegaly present.  Cardiovascular: Normal rate, regular rhythm, normal heart sounds and intact distal pulses.  Exam reveals no gallop.   No murmur heard. Pulmonary/Chest: Effort normal and breath sounds normal. No respiratory distress. He has no wheezes. He has no rales.  Abdominal: Soft. There is no tenderness.  Musculoskeletal: He exhibits no edema.  Lymphadenopathy:    He has no cervical adenopathy.  Neurological:   Sleeps the whole exam Increased tone in all extremities          Assessment & Plan:

## 2013-07-22 NOTE — Assessment & Plan Note (Signed)
Seems to have benefit from the nudexta

## 2013-07-29 DIAGNOSIS — F0280 Dementia in other diseases classified elsewhere without behavioral disturbance: Secondary | ICD-10-CM | POA: Diagnosis not present

## 2013-07-29 DIAGNOSIS — I6990 Unspecified sequelae of unspecified cerebrovascular disease: Secondary | ICD-10-CM | POA: Diagnosis not present

## 2013-07-29 DIAGNOSIS — I679 Cerebrovascular disease, unspecified: Secondary | ICD-10-CM | POA: Diagnosis not present

## 2013-07-31 ENCOUNTER — Other Ambulatory Visit: Payer: Self-pay | Admitting: Internal Medicine

## 2013-08-03 ENCOUNTER — Other Ambulatory Visit: Payer: Self-pay | Admitting: *Deleted

## 2013-08-03 MED ORDER — DERMACLOUD EX CREA: TOPICAL_CREAM | CUTANEOUS | Status: DC

## 2013-08-10 ENCOUNTER — Other Ambulatory Visit: Payer: Self-pay | Admitting: Internal Medicine

## 2013-08-10 NOTE — Telephone Encounter (Signed)
Okay #120 x 0

## 2013-08-10 NOTE — Telephone Encounter (Signed)
rx called into pharmacy

## 2013-08-10 NOTE — Telephone Encounter (Signed)
Last seen on 07/22/13 at a home visit f/u.

## 2013-08-24 DIAGNOSIS — I679 Cerebrovascular disease, unspecified: Secondary | ICD-10-CM

## 2013-08-24 DIAGNOSIS — G3109 Other frontotemporal dementia: Secondary | ICD-10-CM

## 2013-08-24 DIAGNOSIS — F0280 Dementia in other diseases classified elsewhere without behavioral disturbance: Secondary | ICD-10-CM

## 2013-08-24 DIAGNOSIS — I6990 Unspecified sequelae of unspecified cerebrovascular disease: Secondary | ICD-10-CM | POA: Diagnosis not present

## 2013-08-28 DIAGNOSIS — G3109 Other frontotemporal dementia: Secondary | ICD-10-CM | POA: Diagnosis not present

## 2013-08-28 DIAGNOSIS — I679 Cerebrovascular disease, unspecified: Secondary | ICD-10-CM | POA: Diagnosis not present

## 2013-08-28 DIAGNOSIS — F0280 Dementia in other diseases classified elsewhere without behavioral disturbance: Secondary | ICD-10-CM | POA: Diagnosis not present

## 2013-08-28 DIAGNOSIS — I6990 Unspecified sequelae of unspecified cerebrovascular disease: Secondary | ICD-10-CM | POA: Diagnosis not present

## 2013-09-28 DIAGNOSIS — I6990 Unspecified sequelae of unspecified cerebrovascular disease: Secondary | ICD-10-CM | POA: Diagnosis not present

## 2013-09-28 DIAGNOSIS — I679 Cerebrovascular disease, unspecified: Secondary | ICD-10-CM | POA: Diagnosis not present

## 2013-09-28 DIAGNOSIS — G3109 Other frontotemporal dementia: Secondary | ICD-10-CM | POA: Diagnosis not present

## 2013-09-28 DIAGNOSIS — F0280 Dementia in other diseases classified elsewhere without behavioral disturbance: Secondary | ICD-10-CM | POA: Diagnosis not present

## 2013-09-30 ENCOUNTER — Ambulatory Visit: Admitting: Internal Medicine

## 2013-09-30 ENCOUNTER — Encounter: Payer: Self-pay | Admitting: Internal Medicine

## 2013-09-30 VITALS — BP 112/54 | HR 59 | Resp 14

## 2013-09-30 DIAGNOSIS — G3109 Other frontotemporal dementia: Secondary | ICD-10-CM | POA: Diagnosis not present

## 2013-09-30 DIAGNOSIS — R451 Restlessness and agitation: Secondary | ICD-10-CM

## 2013-09-30 DIAGNOSIS — F0280 Dementia in other diseases classified elsewhere without behavioral disturbance: Secondary | ICD-10-CM | POA: Diagnosis not present

## 2013-09-30 DIAGNOSIS — IMO0002 Reserved for concepts with insufficient information to code with codable children: Secondary | ICD-10-CM | POA: Diagnosis not present

## 2013-09-30 DIAGNOSIS — F482 Pseudobulbar affect: Secondary | ICD-10-CM | POA: Diagnosis not present

## 2013-09-30 DIAGNOSIS — L219 Seborrheic dermatitis, unspecified: Secondary | ICD-10-CM

## 2013-09-30 DIAGNOSIS — I6350 Cerebral infarction due to unspecified occlusion or stenosis of unspecified cerebral artery: Secondary | ICD-10-CM | POA: Diagnosis not present

## 2013-09-30 DIAGNOSIS — I639 Cerebral infarction, unspecified: Secondary | ICD-10-CM

## 2013-09-30 MED ORDER — TRIAMCINOLONE ACETONIDE 0.1 % EX CREA: 1.0000 "application " | TOPICAL_CREAM | Freq: Two times a day (BID) | CUTANEOUS | Status: DC | PRN

## 2013-09-30 NOTE — Assessment & Plan Note (Signed)
Diagnosis is not clear cut Will try weaning off the med---first the AM dose, then evening if tolerated

## 2013-09-30 NOTE — Assessment & Plan Note (Signed)
Better on scalp Will try stronger cortisone cream for ear

## 2013-09-30 NOTE — Assessment & Plan Note (Addendum)
Marked progression Seems to be at end stage but hard to predict course Total care Barely awake anymore  Wife has a desire that he not die at home We will try to initiate transfer to hospice home if he seems very close to the end

## 2013-09-30 NOTE — Progress Notes (Signed)
Subjective:    Patient ID: Tanner Rosario, male    DOB: 19-Oct-1945, 68 y.o.   MRN: [id number]  HPI Wife and caregivers are here [facility] is also here  Still sleeping all the time Off the trazodone and not much change May have eyes open for as much as an hour at a time--but probably not more than 2 hours per day Just stares off much of the time Does seem to respond to voice Still will swing at wife and aides at time  Has bath daily in AM around 7:30 with aide Other days it is later (like 9:30-10) He sleeps until he is awoken for the bath Still gets the alprazolam before the bath---still with agitation with care Then will fall asleep  One of the caregivers will just let him sleep Will get him out of bed for breakfast if possible--at times will fall asleep during eating Up for dinner again--wife may need to awaken him at [facility] to eat. 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 ALPRAZolam (XANAX) 0.5 MG tablet TAKE ONE TO TWO TABLETS TWICE A DAY PRIOR TO MORNING AND EVENING CARE. MAY TAKE ONE TABLET THREE TIMES A DAY IF NEEDED.  120 tablet  0  . cholecalciferol (VITAMIN D) 1000 UNITS tablet Take 1,000 Units by mouth daily.      . hydrocortisone 2.5 % cream Apply topically 3 (three) times daily as needed.  30 g  5  .  Infant Care Products (DERMACLOUD) CREA USE AS DIRECTED  430 g  1  . ketoconazole (NIZORAL) 2 % shampoo Apply 1 application topically 2 (two) times a week. Leave on at least 5 minutes  120 mL  11  . levETIRAcetam (KEPPRA) 500 MG tablet TAKE ONE TABLET TWICE A DAY  30 tablet  11  . metroNIDAZOLE (METROGEL) 0.75 % gel Apply topically 2 (two) times daily.  45 g  3  . NUEDEXTA 20-10 MG CAPS TAKE ONE CAPSULE TWICE A DAY  60 capsule  11  . senna-docusate (SENOKOT-S) 8.6-50 MG per tablet Take 1 tablet by mouth 2 (two) times daily.  100 tablet  1   No current facility-administered medications on file prior to visit.    Allergies  Allergen Reactions  . Morphine And Related     Past Medical History  Diagnosis Date  . Frontotemporal dementia   . Hyperlipidemia   . CVA (cerebral infarction)   . Malnutrition   . Melanoma of back 1988    stage 3--removed and 6 month surveillance since then  . Agitation     Past Surgical History  Procedure Laterality Date  . Mri/mra  04/17/11    Normal at [facility]  . Melanoma excision    . Back surgery      twice on low back  . Corneal transplant  needed 3 seperate times    Family History  Problem Relation Age of Onset  . Cancer Mother     lung  . Cancer Father     lung  . Heart disease Father   . Diabetes Sister   . Heart disease Paternal Uncle     all 5 uncles died of MI    History   Social History  . Marital Status: Married    Spouse Name: N/A    Number of Children: 2  . Years of Education: N/A   Occupational History  . School Scientist, physiological     retired with illness   Social History Main Topics  . Smoking status: Former Research scientist (life sciences)  . Smokeless tobacco: Never Used     Comment: quit pipe ~1996 (rare even before this though)  . Alcohol Use: No     Comment: Occ social use or daily beer till sick  . Drug Use: Not on file  . Sexual Activity: Not on file   Other Topics Concern  . Not on file   Social History Narrative   2nd marriage  for both   1 daughter and 1 stepdaughter   Wife holds health care POA   Has DNR order   Does not want hospitalization   Never discussed tube feedings   Review of Systems Eats okay--if he stays awake. Does open and chew when he is fed Bowels regular-- can be looser and larger after strawberries and prune juice    Objective:   Physical Exam  Constitutional: He appears well-nourished. No distress.  Neck: Normal range of motion. Neck supple.  Cardiovascular: Normal rate, regular rhythm and normal heart sounds.  Exam reveals no gallop.   No murmur heard. Pulmonary/Chest: Effort normal and breath sounds normal. No respiratory distress. He has no wheezes. He has no rales.  Abdominal: Soft. There is no tenderness.  Musculoskeletal: He exhibits no edema.  Lymphadenopathy:    He has no cervical adenopathy.  Neurological:  Sleeping  Won't open eyes but responds "hey" to my speaking to him Pulls sheet up to resist my exam Increased tone in all extremities  Skin: No rash noted.  No ulcers          Assessment & Plan:

## 2013-09-30 NOTE — Assessment & Plan Note (Signed)
Persistent physical aggression though seems less an issue Will try to decrease remaining meds---first the nudextra, the alprazolam--as tolerated

## 2013-09-30 NOTE — Assessment & Plan Note (Signed)
Hard to judge whether this affects him at all (from the past)

## 2013-10-02 ENCOUNTER — Telehealth: Payer: Self-pay

## 2013-10-02 MED ORDER — TRIAMCINOLONE ACETONIDE 0.5 % EX CREA: 1.0000 "application " | TOPICAL_CREAM | Freq: Two times a day (BID) | CUTANEOUS | Status: DC | PRN

## 2013-10-02 NOTE — Telephone Encounter (Signed)
[facility] left v/m; Dr Silvio Pate saw pt on 09/30/13 and since triamcinolone cream was not clearing up pts problem Mrs Wherry thought Dr Silvio Pate was going to order stronger cream. Mrs Westfall picked up med but it is the same med pt was already using tiramcinolone. Marnie request cb with new med. [place].

## 2013-10-02 NOTE — Telephone Encounter (Signed)
Tanner Rosario of Hospice advised.

## 2013-10-02 NOTE — Telephone Encounter (Signed)
Change to the 0.5% cream.  Sent.  Thanks.

## 2013-10-16 ENCOUNTER — Other Ambulatory Visit: Payer: Self-pay | Admitting: Internal Medicine

## 2013-10-28 DIAGNOSIS — I6990 Unspecified sequelae of unspecified cerebrovascular disease: Secondary | ICD-10-CM | POA: Diagnosis not present

## 2013-10-28 DIAGNOSIS — I679 Cerebrovascular disease, unspecified: Secondary | ICD-10-CM | POA: Diagnosis not present

## 2013-10-28 DIAGNOSIS — F0280 Dementia in other diseases classified elsewhere without behavioral disturbance: Secondary | ICD-10-CM | POA: Diagnosis not present

## 2013-11-02 ENCOUNTER — Other Ambulatory Visit: Payer: Self-pay | Admitting: Internal Medicine

## 2013-11-05 DIAGNOSIS — I679 Cerebrovascular disease, unspecified: Secondary | ICD-10-CM | POA: Diagnosis not present

## 2013-11-05 DIAGNOSIS — I6990 Unspecified sequelae of unspecified cerebrovascular disease: Secondary | ICD-10-CM | POA: Diagnosis not present

## 2013-11-05 DIAGNOSIS — G3109 Other frontotemporal dementia: Secondary | ICD-10-CM

## 2013-11-05 DIAGNOSIS — F0280 Dementia in other diseases classified elsewhere without behavioral disturbance: Secondary | ICD-10-CM

## 2013-11-18 ENCOUNTER — Other Ambulatory Visit: Payer: Self-pay | Admitting: Internal Medicine

## 2013-11-18 NOTE — Telephone Encounter (Signed)
Ok to fill 

## 2013-11-18 NOTE — Telephone Encounter (Signed)
Okay to fill both with 1 year refills

## 2013-11-18 NOTE — Telephone Encounter (Signed)
rx sent to pharmacy by e-script  

## 2013-11-28 DIAGNOSIS — F0280 Dementia in other diseases classified elsewhere without behavioral disturbance: Secondary | ICD-10-CM | POA: Diagnosis not present

## 2013-11-28 DIAGNOSIS — I679 Cerebrovascular disease, unspecified: Secondary | ICD-10-CM | POA: Diagnosis not present

## 2013-11-28 DIAGNOSIS — I6990 Unspecified sequelae of unspecified cerebrovascular disease: Secondary | ICD-10-CM | POA: Diagnosis not present

## 2013-11-28 DIAGNOSIS — G3109 Other frontotemporal dementia: Secondary | ICD-10-CM | POA: Diagnosis not present

## 2013-12-01 ENCOUNTER — Other Ambulatory Visit: Payer: Self-pay | Admitting: Internal Medicine

## 2013-12-08 ENCOUNTER — Other Ambulatory Visit: Payer: Self-pay | Admitting: Internal Medicine

## 2013-12-08 NOTE — Telephone Encounter (Signed)
rx called into pharmacy

## 2013-12-08 NOTE — Telephone Encounter (Signed)
Okay #90 x 0 

## 2013-12-08 NOTE — Telephone Encounter (Signed)
08/10/13 

## 2013-12-09 ENCOUNTER — Ambulatory Visit: Admitting: Internal Medicine

## 2013-12-09 ENCOUNTER — Encounter: Payer: Self-pay | Admitting: Internal Medicine

## 2013-12-09 VITALS — BP 112/88 | HR 54 | Resp 12

## 2013-12-09 DIAGNOSIS — F482 Pseudobulbar affect: Secondary | ICD-10-CM | POA: Diagnosis not present

## 2013-12-09 DIAGNOSIS — F0280 Dementia in other diseases classified elsewhere without behavioral disturbance: Secondary | ICD-10-CM

## 2013-12-09 DIAGNOSIS — IMO0002 Reserved for concepts with insufficient information to code with codable children: Secondary | ICD-10-CM | POA: Diagnosis not present

## 2013-12-09 DIAGNOSIS — G3109 Other frontotemporal dementia: Principal | ICD-10-CM

## 2013-12-09 DIAGNOSIS — L219 Seborrheic dermatitis, unspecified: Secondary | ICD-10-CM | POA: Diagnosis not present

## 2013-12-09 DIAGNOSIS — I639 Cerebral infarction, unspecified: Secondary | ICD-10-CM

## 2013-12-09 DIAGNOSIS — I6350 Cerebral infarction due to unspecified occlusion or stenosis of unspecified cerebral artery: Secondary | ICD-10-CM

## 2013-12-09 DIAGNOSIS — R451 Restlessness and agitation: Secondary | ICD-10-CM

## 2013-12-09 NOTE — Assessment & Plan Note (Signed)
Severe  PPS 20% Barely talks Total care otherwise and incontinent Seems to have stabilized at lower level--can hold off on consideration of hospice home

## 2013-12-09 NOTE — Assessment & Plan Note (Signed)
Though often barely responsive--will still get agitated and swing at caregivers Will try decreasing the alprazolam to 0.25 before AM care to see if that effects daytime somnolence

## 2013-12-09 NOTE — Progress Notes (Signed)
Subjective:    Patient ID: Tanner Rosario, male    DOB: 1946-01-11, 68 y.o.   MRN: [id number]  HPI Wife is here Hassan Rowan caregiver is here also Reviewed status with [name]--hospice RN  Was able to cut back the nudextra to just at bedtime When they stopped the evening dose--he wasn't sleeping at night Sleeping much of the day still Often sleeps till 10-11AM and has to be awakened Will briefly awaken for bathing--he still gets agitated. Getting the alprazolam each morning for AM care. Hasn't been needing it later on  Rare words "No" occasionally. Mostly mumbling otherwise  Now generally only awake long enough for 2 meals Eats okay still  Doesn't seem to have lost weight  Still with scalp seborrhea As long as they shampoo twice with the ketoconazole, it stays under control  Current Outpatient Prescriptions on File Prior to Visit  Medication Sig Dispense Refill  . cholecalciferol (VITAMIN D) 1000 UNITS tablet Take 1,000 Units by mouth daily.      . hydrocortisone 2.5 % cream Apply topically 3 (three) times daily as needed.  30 g  5  . Infant Care Products (DERMACLOUD) CREA USE AS DIRECTED  430 g  6  . ketoconazole (NIZORAL) 2 % shampoo Apply 1 application topically 2 (two) times a week. Leave on at least 5 minutes  120 mL  11  . levETIRAcetam (KEPPRA) 500 MG tablet TAKE ONE TABLET TWICE A DAY  30 tablet  11  . metroNIDAZOLE (METROGEL) 0.75 % gel Apply topically 2 (two) times daily.  45 g  3  . SENNA S 8.6-50 MG per tablet TAKE ONE TABLET TWICE A DAY  100 tablet  0  . triamcinolone cream (KENALOG) 0.5 % Apply 1 application topically 2 (two) times daily as needed.  30 g  0   No current facility-administered medications on file prior to visit.    Allergies  Allergen Reactions  . Morphine And Related     Past Medical History  Diagnosis Date  . Frontotemporal dementia   . Hyperlipidemia   . CVA (cerebral infarction)   . Malnutrition   . Melanoma of back 1988    stage  3--removed and 6 month surveillance since then  . Agitation     Past Surgical History  Procedure Laterality Date  . Mri/mra  04/17/11    Normal at [facility]  . Melanoma excision    . Back surgery      twice on low back  . Corneal transplant      needed 3 seperate times    Family History  Problem Relation Age of Onset  . Cancer Mother     lung  . Cancer Father     lung  . Heart disease Father   . Diabetes Sister   . Heart disease Paternal Uncle     all 5 uncles died of MI    History   Social History  . Marital Status: Married    Spouse Name: N/A    Number of Children: 2  . Years of Education: N/A   Occupational History  . School Scientist, physiological     retired with illness   Social History Main Topics  . Smoking status: Former Research scientist (life sciences)  . Smokeless tobacco: Never Used     Comment: quit pipe ~1996 (rare even before this though)  . Alcohol Use: No     Comment: Occ social use or daily beer till sick  . Drug Use: Not on file  . Sexual  Activity: Not on file   Other Topics Concern  . Not on file   Social History Narrative   2nd marriage for both   1 daughter and 1 stepdaughter   Wife holds health care POA   Has DNR order   Does not want hospitalization   Never discussed tube feedings   Review of Systems Seems to be bothered by his right ear--wife tried to clean out but she is concerned about wax Bowels are okay--- usually every other day but as often as twice a day Voids okay--but seems to hold himself and shake when he voids    Objective:   Physical Exam  Constitutional: He appears well-developed. No distress.  Doesn't really engage  HENT:  Cerumen in right canal  Neck: No thyromegaly present.  Cardiovascular: Normal rate, regular rhythm and normal heart sounds.  Exam reveals no gallop.   No murmur heard. Pulmonary/Chest: Effort normal and breath sounds normal. No respiratory distress. He has no wheezes. He has no rales.  Abdominal: Soft. There is no tenderness.    Musculoskeletal: He exhibits no edema.  Small (probable) lipomas in proximal right forearm---reassured  Lymphadenopathy:    He has no cervical adenopathy.  Neurological:  Mildly increased tone in LE No focal weakness but not really moving actively during my visit (will still swing at caregivers)          Assessment & Plan:

## 2013-12-09 NOTE — Assessment & Plan Note (Signed)
In past May have some degree of vascular dementia as well

## 2013-12-09 NOTE — Assessment & Plan Note (Signed)
Does well with the ketoconazole shampoo

## 2013-12-09 NOTE — Assessment & Plan Note (Signed)
Not a clear cut diagnosis If sleeps better at night--can try to stop the PM nudextra again

## 2013-12-28 DIAGNOSIS — I6990 Unspecified sequelae of unspecified cerebrovascular disease: Secondary | ICD-10-CM | POA: Diagnosis not present

## 2013-12-28 DIAGNOSIS — I679 Cerebrovascular disease, unspecified: Secondary | ICD-10-CM | POA: Diagnosis not present

## 2013-12-28 DIAGNOSIS — G3109 Other frontotemporal dementia: Secondary | ICD-10-CM

## 2013-12-28 DIAGNOSIS — F0280 Dementia in other diseases classified elsewhere without behavioral disturbance: Secondary | ICD-10-CM | POA: Diagnosis not present

## 2013-12-29 DIAGNOSIS — I679 Cerebrovascular disease, unspecified: Secondary | ICD-10-CM | POA: Diagnosis not present

## 2013-12-29 DIAGNOSIS — G3109 Other frontotemporal dementia: Secondary | ICD-10-CM | POA: Diagnosis not present

## 2013-12-29 DIAGNOSIS — F0280 Dementia in other diseases classified elsewhere without behavioral disturbance: Secondary | ICD-10-CM | POA: Diagnosis not present

## 2013-12-29 DIAGNOSIS — I6990 Unspecified sequelae of unspecified cerebrovascular disease: Secondary | ICD-10-CM | POA: Diagnosis not present

## 2014-01-05 ENCOUNTER — Other Ambulatory Visit: Payer: Self-pay | Admitting: Internal Medicine

## 2014-01-05 NOTE — Telephone Encounter (Signed)
rx called into pharmacy

## 2014-01-05 NOTE — Telephone Encounter (Signed)
12/08/13 

## 2014-01-05 NOTE — Telephone Encounter (Signed)
Okay #60 x 0 

## 2014-01-08 ENCOUNTER — Telehealth: Payer: Self-pay

## 2014-01-08 NOTE — Telephone Encounter (Signed)
Let Tanner Rosario know I am not sure how soon I can get out there. If she or wife see signs of infection, I can treat. I don't recommend extraction given his overall status

## 2014-01-08 NOTE — Telephone Encounter (Signed)
Lattie Haw nurse with Hospice left v/m; pt broke off upper canine tooth and dentist and oral surgeon are concerned about tooth abscessing and request Dr Silvio Pate to see pt sooner than scheduled appt in Oct. To ck to see if any signs of infection at broken tooth and condition of other teeth (pt is home visit). Pt is not having any pain or sensitivity. If pt has to have tooth extracted pt would have to be taken to hospital and procedure done at hospital by oral surgeon. Lisa request cb.

## 2014-01-08 NOTE — Telephone Encounter (Signed)
Spoke with hospice nurse and advised results, she will let the wife know

## 2014-01-12 ENCOUNTER — Other Ambulatory Visit: Payer: Self-pay | Admitting: Internal Medicine

## 2014-01-25 NOTE — Telephone Encounter (Addendum)
[facility]anner Ros[facility] with [facility] left v/m; pt has broken tooth 2 weeks ago, now has swelling on that side of face; swelling goes almost to the eye; request penicillin to [place].(Dentist also recommended pt get penicillin.) Please advise.

## 2014-01-26 MED ORDER — AMOXICILLIN 250 MG/5ML PO SUSR: 1000.0000 mg | Freq: Three times a day (TID) | ORAL | Status: DC

## 2014-01-26 NOTE — Addendum Note (Signed)
Addended by: Viviana Simpler I on: 01/26/2014 07:48 AM   Modules accepted: Orders

## 2014-01-26 NOTE — Telephone Encounter (Signed)
Discussed with wife She is worried about him becoming septic from this Appears to be early infection---will start with amoxil and see how he responds

## 2014-01-28 DIAGNOSIS — I6990 Unspecified sequelae of unspecified cerebrovascular disease: Secondary | ICD-10-CM | POA: Diagnosis not present

## 2014-01-28 DIAGNOSIS — I679 Cerebrovascular disease, unspecified: Secondary | ICD-10-CM | POA: Diagnosis not present

## 2014-01-28 DIAGNOSIS — G3109 Other frontotemporal dementia: Secondary | ICD-10-CM | POA: Diagnosis not present

## 2014-02-11 ENCOUNTER — Other Ambulatory Visit: Payer: Self-pay | Admitting: Internal Medicine

## 2014-02-11 NOTE — Telephone Encounter (Signed)
Rx called in to [facility] as ordered

## 2014-02-11 NOTE — Telephone Encounter (Signed)
Electronic Rx request for xanax received. Patient last seen in office 12/09/13 and medication last filled 01/05/14. Please advise.

## 2014-02-11 NOTE — Telephone Encounter (Signed)
Okay #60 x 0 

## 2014-02-23 ENCOUNTER — Encounter: Payer: Self-pay | Admitting: Internal Medicine

## 2014-02-23 ENCOUNTER — Ambulatory Visit: Payer: Medicare Other | Admitting: Internal Medicine

## 2014-02-23 VITALS — BP 130/82 | HR 64 | Resp 13

## 2014-02-23 DIAGNOSIS — R451 Restlessness and agitation: Secondary | ICD-10-CM

## 2014-02-23 DIAGNOSIS — K047 Periapical abscess without sinus: Secondary | ICD-10-CM | POA: Diagnosis not present

## 2014-02-23 DIAGNOSIS — I639 Cerebral infarction, unspecified: Secondary | ICD-10-CM

## 2014-02-23 DIAGNOSIS — Z23 Encounter for immunization: Secondary | ICD-10-CM | POA: Diagnosis not present

## 2014-02-23 DIAGNOSIS — G3109 Other frontotemporal dementia: Secondary | ICD-10-CM | POA: Diagnosis not present

## 2014-02-23 DIAGNOSIS — F0280 Dementia in other diseases classified elsewhere without behavioral disturbance: Secondary | ICD-10-CM

## 2014-02-23 DIAGNOSIS — F482 Pseudobulbar affect: Secondary | ICD-10-CM

## 2014-02-23 NOTE — Assessment & Plan Note (Signed)
This was not clear cut--but gone now No Rx

## 2014-02-23 NOTE — Assessment & Plan Note (Signed)
Better with amoxil Eating okay ---though softer foods  Would retreat with antibiotic prn I told wife I would strongly not recommend dental visit

## 2014-02-23 NOTE — Assessment & Plan Note (Signed)
No longer a problem with the progression of his dementia Off the nudextra Family and caregivers thought he was resisting--but I think he just is hypertonic

## 2014-02-23 NOTE — Assessment & Plan Note (Signed)
Seems to be approaching end stage Still on hospice Only awake a few hours a day Still eating and doesn't appear to have lost weight

## 2014-02-23 NOTE — Progress Notes (Signed)
Subjective:    Patient ID: Tanner Rosario, male    DOB: 1945/06/03, 68 y.o.   MRN: [id number]  HPI Wife and daughter is here [place] --hospice RN  Finished the amoxicillin Needs review of dental issues  Had eye infection Got erythro ointment and sulfa by eye doctor based on pictures they sent  Now sleeping more and more At most up 4 hours per day (like 1.5-2 hours around dinner time at maximum) Not really as agitated Stiffens with care but not hitting--or rarely Seems more resistant to stimulation Seemed to be more agitated at first off the nudexta--but then settled down  Needs to be fed still Starts slow but usually eats okay  Current Outpatient Prescriptions on File Prior to Visit  Medication Sig Dispense Refill  . ALPRAZolam (XANAX) 0.5 MG tablet TAKE ONE TO TWO TABLETS TWICE A DAY PRIOR TO MORNING AND EVENING CARE. MAY TAKE ONE TABLET THREE TIMES A DAY IF NEEDED.  60 tablet  0  . cholecalciferol (VITAMIN D) 1000 UNITS tablet Take 1,000 Units by mouth daily.      . hydrocortisone 2.5 % cream Apply topically 3 (three) times daily as needed.  30 g  5  . Infant Care Products (DERMACLOUD) CREA USE AS DIRECTED  430 g  6  . ketoconazole (NIZORAL) 2 % shampoo Apply 1 application topically 2 (two) times a week. Leave on at least 5 minutes  120 mL  11  . levETIRAcetam (KEPPRA) 500 MG tablet TAKE ONE TABLET TWICE A DAY  30 tablet  11  . metroNIDAZOLE (METROGEL) 0.75 % gel Apply topically 2 (two) times daily.  45 g  3  . SENNA S 8.6-50 MG per tablet TAKE ONE TABLET TWICE A DAY  100 tablet  1  . triamcinolone cream (KENALOG) 0.5 % Apply 1 application topically 2 (two) times daily as needed.  30 g  0   No current facility-administered medications on file prior to visit.    Allergies  Allergen Reactions  . Morphine And Related     Past Medical History  Diagnosis Date  . Frontotemporal dementia   . Hyperlipidemia   . CVA (cerebral infarction)   . Malnutrition   . Melanoma of  back 1988    stage 3--removed and 6 month surveillance since then  . Agitation     Past Surgical History  Procedure Laterality Date  . Mri/mra  04/17/11    Normal at [facility]  . Melanoma excision    . Back surgery      twice on low back  . Corneal transplant      needed 3 seperate times    Family History  Problem Relation Age of Onset  . Cancer Mother     lung  . Cancer Father     lung  . Heart disease Father   . Diabetes Sister   . Heart disease Paternal Uncle     all 5 uncles died of MI    History   Social History  . Marital Status: Married    Spouse Name: N/A    Number of Children: 2  . Years of Education: N/A   Occupational History  . School Scientist, physiological     retired with illness   Social History Main Topics  . Smoking status: Former Research scientist (life sciences)  . Smokeless tobacco: Never Used     Comment: quit pipe ~1996 (rare even before this though)  . Alcohol Use: No     Comment: Occ social use or  daily beer till sick  . Drug Use: Not on file  . Sexual Activity: Not on file   Other Topics Concern  . Not on file   Social History Narrative   2nd marriage for both   1 daughter and 1 stepdaughter   Wife holds health care POA   Has DNR order   Does not want hospitalization   Never discussed tube feedings   Review of Systems Usually moves bowels okay--occ slow. Warm prune juice helps Voids okay No apparent arthritis pain No skin ulcers or other skin issues Did have episode of wheezing--lungs were okay. Now with humidifier in room which helps the upper airway noise    Objective:   Physical Exam  Constitutional: No distress.  HENT:  Broken left upper canine without infection now No other major tooth problems clear (at least no inflammation)  Cardiovascular: Normal rate, regular rhythm and normal heart sounds.  Exam reveals no gallop.   No murmur heard. Pulmonary/Chest: Effort normal and breath sounds normal. No respiratory distress. He has no wheezes. He has no rales.   Abdominal: Soft. There is no tenderness.  Neurological:  Sleeping Doesn't really respond to my voice or touch Moderately increased tone in all extremities   Psychiatric:  No agitation or response now          Assessment & Plan:

## 2014-02-28 DIAGNOSIS — I679 Cerebrovascular disease, unspecified: Secondary | ICD-10-CM | POA: Diagnosis not present

## 2014-02-28 DIAGNOSIS — G3109 Other frontotemporal dementia: Secondary | ICD-10-CM | POA: Diagnosis not present

## 2014-02-28 DIAGNOSIS — I6990 Unspecified sequelae of unspecified cerebrovascular disease: Secondary | ICD-10-CM | POA: Diagnosis not present

## 2014-03-01 DIAGNOSIS — I679 Cerebrovascular disease, unspecified: Secondary | ICD-10-CM | POA: Diagnosis not present

## 2014-03-01 DIAGNOSIS — G3109 Other frontotemporal dementia: Secondary | ICD-10-CM | POA: Diagnosis not present

## 2014-03-01 DIAGNOSIS — I6990 Unspecified sequelae of unspecified cerebrovascular disease: Secondary | ICD-10-CM | POA: Diagnosis not present

## 2014-03-05 ENCOUNTER — Other Ambulatory Visit: Payer: Self-pay | Admitting: *Deleted

## 2014-03-05 MED ORDER — DERMACLOUD EX CREA: TOPICAL_CREAM | CUTANEOUS | Status: DC

## 2014-03-19 ENCOUNTER — Telehealth: Payer: Self-pay | Admitting: Internal Medicine

## 2014-03-19 NOTE — Telephone Encounter (Signed)
Call from [facility] Fever 99-100 for a couple of day--now worse--- 101.7 Cough and wheezing Can't clear chest congestion Wife very worried  Will try augmentin ES 7.53ml bid for 7 days Albuterol nebs q 4 hours prn  If worsens, should look to send to hospice home

## 2014-03-30 DIAGNOSIS — I6990 Unspecified sequelae of unspecified cerebrovascular disease: Secondary | ICD-10-CM | POA: Diagnosis not present

## 2014-03-30 DIAGNOSIS — G3109 Other frontotemporal dementia: Secondary | ICD-10-CM | POA: Diagnosis not present

## 2014-03-30 DIAGNOSIS — I679 Cerebrovascular disease, unspecified: Secondary | ICD-10-CM | POA: Diagnosis not present

## 2014-04-20 ENCOUNTER — Other Ambulatory Visit: Payer: Self-pay | Admitting: Internal Medicine

## 2014-04-27 DIAGNOSIS — R531 Weakness: Secondary | ICD-10-CM | POA: Diagnosis not present

## 2014-04-30 DIAGNOSIS — I679 Cerebrovascular disease, unspecified: Secondary | ICD-10-CM | POA: Diagnosis not present

## 2014-04-30 DIAGNOSIS — G3109 Other frontotemporal dementia: Secondary | ICD-10-CM | POA: Diagnosis not present

## 2014-04-30 DIAGNOSIS — I6990 Unspecified sequelae of unspecified cerebrovascular disease: Secondary | ICD-10-CM | POA: Diagnosis not present

## 2014-05-04 DIAGNOSIS — I6990 Unspecified sequelae of unspecified cerebrovascular disease: Secondary | ICD-10-CM | POA: Diagnosis not present

## 2014-05-04 DIAGNOSIS — G3109 Other frontotemporal dementia: Secondary | ICD-10-CM | POA: Diagnosis not present

## 2014-05-04 DIAGNOSIS — I679 Cerebrovascular disease, unspecified: Secondary | ICD-10-CM | POA: Diagnosis not present

## 2014-05-12 ENCOUNTER — Ambulatory Visit: Admitting: Internal Medicine

## 2014-05-12 ENCOUNTER — Encounter: Payer: Self-pay | Admitting: Internal Medicine

## 2014-05-12 VITALS — BP 136/78 | HR 74 | Resp 14

## 2014-05-12 DIAGNOSIS — I6339 Cerebral infarction due to thrombosis of other cerebral artery: Secondary | ICD-10-CM | POA: Diagnosis not present

## 2014-05-12 DIAGNOSIS — G3109 Other frontotemporal dementia: Secondary | ICD-10-CM

## 2014-05-12 DIAGNOSIS — R451 Restlessness and agitation: Secondary | ICD-10-CM | POA: Diagnosis not present

## 2014-05-12 DIAGNOSIS — K047 Periapical abscess without sinus: Secondary | ICD-10-CM | POA: Diagnosis not present

## 2014-05-12 DIAGNOSIS — F0280 Dementia in other diseases classified elsewhere without behavioral disturbance: Secondary | ICD-10-CM

## 2014-05-12 NOTE — Assessment & Plan Note (Signed)
Teeth not good but no obvious symptoms No action for now

## 2014-05-12 NOTE — Assessment & Plan Note (Signed)
Severe and end stage PPS 20%  No communication other than to resist exam Total care/incontinent Still hospice appropriate

## 2014-05-12 NOTE — Progress Notes (Signed)
Subjective:    Patient ID: Tanner Rosario, male    DOB: 06-22-1945, 69 y.o.   MRN: [id number]  HPI Wife is here 2 aides also--she has help for 2 shifts and she does his care on nights  He did have 5 day respite--- he was at hospice home She spent most of the time there---mostly was for help to have time off for holidays  Still sleeping most of the day Will awaken ~10AM--- get up for breakfast then sleep again till evening Out of bed with lift for meals and to change as needed  Eating fair Has lost skin thickness though Still coughs after eating and drinking at times Occasionally seems full , and won't eat dinner  Still has some physical aggression--mostly with bath and at times with changing diaper Gets alprazolam every morning--hasn't needed prn doses  Current Outpatient Prescriptions on File Prior to Visit  Medication Sig Dispense Refill  . ALPRAZolam (XANAX) 0.5 MG tablet TAKE ONE TO TWO TABLETS TWICE A DAY PRIOR TO MORNING AND EVENING CARE. MAY TAKE ONE TABLET THREE TIMES A DAY IF NEEDED. 60 tablet 0  . cholecalciferol (VITAMIN D) 1000 UNITS tablet Take 1,000 Units by mouth daily.    . hydrocortisone 2.5 % cream Apply topically 3 (three) times daily as needed. 30 g 5  . Infant Care Products (DERMACLOUD) CREA USE AS DIRECTED 430 g 6  . ketoconazole (NIZORAL) 2 % shampoo Apply 1 application topically 2 (two) times a week. Leave on at least 5 minutes 120 mL 11  . levETIRAcetam (KEPPRA) 500 MG tablet TAKE ONE TABLET TWICE A DAY 30 tablet 11  . metroNIDAZOLE (METROGEL) 0.75 % gel Apply topically 2 (two) times daily. 45 g 3  . SENNA S 8.6-50 MG per tablet TAKE ONE TABLET TWICE A DAY 100 tablet 1  . triamcinolone cream (KENALOG) 0.5 % Apply 1 application topically 2 (two) times daily as needed. 30 g 0   No current facility-administered medications on file prior to visit.    Allergies  Allergen Reactions  . Morphine And Related     Past Medical History  Diagnosis Date  .  Frontotemporal dementia   . Hyperlipidemia   . CVA (cerebral infarction)   . Malnutrition   . Melanoma of back 1988    stage 3--removed and 6 month surveillance since then  . Agitation     Past Surgical History  Procedure Laterality Date  . Mri/mra  04/17/11    Normal at [facility]  . Melanoma excision    . Back surgery      twice on low back  . Corneal transplant      needed 3 seperate times    Family History  Problem Relation Age of Onset  . Cancer Mother     lung  . Cancer Father     lung  . Heart disease Father   . Diabetes Sister   . Heart disease Paternal Uncle     all 5 uncles died of MI    History   Social History  . Marital Status: Married    Spouse Name: N/A    Number of Children: 2  . Years of Education: N/A   Occupational History  . School Scientist, physiological     retired with illness   Social History Main Topics  . Smoking status: Former Research scientist (life sciences)  . Smokeless tobacco: Never Used     Comment: quit pipe ~1996 (rare even before this though)  . Alcohol Use: No  Comment: Occ social use or daily beer till sick  . Drug Use: Not on file  . Sexual Activity: Not on file   Other Topics Concern  . Not on file   Social History Narrative   2nd marriage for both   1 daughter and 1 stepdaughter   Wife holds health care POA   Has DNR order   Does not want hospitalization   Never discussed tube feedings   Review of Systems Bowels hard lately --despite prune juice. Discussed adding some miralax powder to the prune juice No skin ulcers but there is a pimple on his bottom since yesterday. Some cuts on legs---may be from the lift No apparent relapse of dental infection Now using OTC eye drops for them seeming irritated    Objective:   Physical Exam  Constitutional: No distress.  Neck: No thyromegaly present.  Cardiovascular: Normal rate, regular rhythm and normal heart sounds.  Exam reveals no gallop.   No murmur heard. Pulmonary/Chest: Effort normal and breath  sounds normal. No respiratory distress. He has no wheezes. He has no rales.  Abdominal: Soft. There is no tenderness.  Musculoskeletal: He exhibits no edema.  Lymphadenopathy:    He has no cervical adenopathy.  Neurological:  Sleeping Doesn't really respond except to mildly resist my taking his BP Increased tone throughout No active use of extremities while I was there          Assessment & Plan:

## 2014-05-12 NOTE — Assessment & Plan Note (Signed)
Still has him times---especially with baths Some better with regular alprazolam in the morning Will continue the keppra as his agitation seemed to worsen when we tried without it (and I don't think it was ever for seizures)

## 2014-05-12 NOTE — Assessment & Plan Note (Signed)
May be part of the picture of his dementia

## 2014-05-14 ENCOUNTER — Other Ambulatory Visit: Payer: Self-pay | Admitting: Internal Medicine

## 2014-05-31 DIAGNOSIS — G3109 Other frontotemporal dementia: Secondary | ICD-10-CM | POA: Diagnosis not present

## 2014-05-31 DIAGNOSIS — I6990 Unspecified sequelae of unspecified cerebrovascular disease: Secondary | ICD-10-CM | POA: Diagnosis not present

## 2014-05-31 DIAGNOSIS — I679 Cerebrovascular disease, unspecified: Secondary | ICD-10-CM | POA: Diagnosis not present

## 2014-06-10 ENCOUNTER — Other Ambulatory Visit: Payer: Self-pay | Admitting: Internal Medicine

## 2014-06-23 DIAGNOSIS — I679 Cerebrovascular disease, unspecified: Secondary | ICD-10-CM | POA: Diagnosis not present

## 2014-06-23 DIAGNOSIS — G3109 Other frontotemporal dementia: Secondary | ICD-10-CM | POA: Diagnosis not present

## 2014-06-23 DIAGNOSIS — I6990 Unspecified sequelae of unspecified cerebrovascular disease: Secondary | ICD-10-CM | POA: Diagnosis not present

## 2014-06-29 DIAGNOSIS — G3109 Other frontotemporal dementia: Secondary | ICD-10-CM | POA: Diagnosis not present

## 2014-06-29 DIAGNOSIS — I679 Cerebrovascular disease, unspecified: Secondary | ICD-10-CM | POA: Diagnosis not present

## 2014-06-29 DIAGNOSIS — I6990 Unspecified sequelae of unspecified cerebrovascular disease: Secondary | ICD-10-CM | POA: Diagnosis not present

## 2014-07-15 ENCOUNTER — Other Ambulatory Visit: Payer: Self-pay | Admitting: Internal Medicine

## 2014-07-17 ENCOUNTER — Other Ambulatory Visit: Payer: Self-pay | Admitting: Internal Medicine

## 2014-07-21 ENCOUNTER — Encounter: Payer: Self-pay | Admitting: Internal Medicine

## 2014-07-21 ENCOUNTER — Ambulatory Visit: Admitting: Internal Medicine

## 2014-07-21 VITALS — BP 130/78 | HR 61 | Resp 19

## 2014-07-21 DIAGNOSIS — I639 Cerebral infarction, unspecified: Secondary | ICD-10-CM

## 2014-07-21 DIAGNOSIS — F0280 Dementia in other diseases classified elsewhere without behavioral disturbance: Secondary | ICD-10-CM

## 2014-07-21 DIAGNOSIS — L219 Seborrheic dermatitis, unspecified: Secondary | ICD-10-CM | POA: Diagnosis not present

## 2014-07-21 DIAGNOSIS — R451 Restlessness and agitation: Secondary | ICD-10-CM

## 2014-07-21 DIAGNOSIS — G3109 Other frontotemporal dementia: Secondary | ICD-10-CM

## 2014-07-21 DIAGNOSIS — I6339 Cerebral infarction due to thrombosis of other cerebral artery: Secondary | ICD-10-CM

## 2014-07-21 NOTE — Assessment & Plan Note (Signed)
Hard to tell how much affect this has with the severity of the dementia

## 2014-07-21 NOTE — Assessment & Plan Note (Signed)
Seems to be controlled with cream

## 2014-07-21 NOTE — Progress Notes (Signed)
Subjective:    Patient ID: Tanner Rosario, male    DOB: [birth year], 69 y.o.   MRN: [id number]  HPI WIfe and caregivers are here Daughter also [name]--hospice nurse  Status relatively unchanged Still has episodic agitation Gets the alprazolam every morning before care (especially bath) Usually doesn't need it otherwise  Still sleeps most of the time Has to be roused for care in AM Gets up with lift ~11:30 -this is his breakfast Wheelchair then recliner and he sleeps most of that time also Goes back to bed 2:30 or so--and then back up for dinner  Still gets agitated with personal care Usually brief and can be redirected May grab for glass or hold a finger food--but mostly needs total care Will occasional wipe his own mouth with cloth put in his hand No words or speech--other than rare outbursts (often curses). May ask for "more". Jabbers incomprehensibly  Current Outpatient Prescriptions on File Prior to Visit  Medication Sig Dispense Refill  . ALPRAZolam (XANAX) 0.5 MG tablet TAKE ONE TO TWO TABLETS TWICE A DAY PRIOR TO MORNING AND EVENING CARE. MAY TAKE ONE TABLET THREE TIMES A DAY IF NEEDED. (Patient taking differently: TAKE ONE TO TWO TABLETS TWICE A DAY PRIOR TO MORNING AND EVENING CARE AS NEEDED) 60 tablet 0  . cholecalciferol (VITAMIN D) 1000 UNITS tablet Take 1,000 Units by mouth daily.    . hydrocortisone 2.5 % cream Apply topically 3 (three) times daily as needed. 30 g 5  . Infant Care Products (DERMACLOUD) CREA USE AS DIRECTED 430 g 1  . ketoconazole (NIZORAL) 2 % shampoo Apply 1 application topically 2 (two) times a week. Leave on at least 5 minutes 120 mL 11  . levETIRAcetam (KEPPRA) 500 MG tablet TAKE ONE TABLET TWICE A DAY 30 tablet 11  . SENNA S 8.6-50 MG per tablet TAKE ONE TABLET TWICE A DAY 100 tablet 1  . triamcinolone cream (KENALOG) 0.5 % Apply 1 application topically 2 (two) times daily as needed. 30 g 0   No current facility-administered medications on  file prior to visit.    Allergies  Allergen Reactions  . Morphine And Related     Past Medical History  Diagnosis Date  . Frontotemporal dementia   . Hyperlipidemia   . CVA (cerebral infarction)   . Malnutrition   . Melanoma of back 1988    stage 3--removed and 6 month surveillance since then  . Agitation     Past Surgical History  Procedure Laterality Date  . Mri/mra  04/17/11    Normal at [facility]  . Melanoma excision    . Back surgery      twice on low back  . Corneal transplant      needed 3 seperate times    Family History  Problem Relation Age of Onset  . Cancer Mother     lung  . Cancer Father     lung  . Heart disease Father   . Diabetes Sister   . Heart disease Paternal Uncle     all 5 uncles died of MI    History   Social History  . Marital Status: Married    Spouse Name: N/A  . Number of Children: 2  . Years of Education: N/A   Occupational History  . School Scientist, physiological     retired with illness   Social History Main Topics  . Smoking status: Former Research scientist (life sciences)  . Smokeless tobacco: Never Used     Comment: quit pipe ~1996 (  rare even before this though)  . Alcohol Use: No     Comment: Occ social use or daily beer till sick  . Drug Use: Not on file  . Sexual Activity: Not on file   Other Topics Concern  . Not on file   Social History Narrative   2nd marriage for both   1 daughter and 1 stepdaughter   Wife holds health care POA   Has DNR order   Does not want hospitalization   Never discussed tube feedings   Review of Systems Eats okay still.  Doesn't seem to have lost weight Bowels have been okay Voids infrequently ---doesn't seem to have any obstruction Limited oral fluids due to his constant sleeping No rashes now. No bed sores Wife concerned there is something different under his chin--- ?swollen Had some loose stools 3 days ago--nothing persistent    Objective:   Physical Exam  Constitutional: He appears well-nourished. No  distress.  Neck:  Soft tissue over trachea No nodes or thyroid enlargement  Cardiovascular: Normal rate, regular rhythm, normal heart sounds and intact distal pulses.  Exam reveals no gallop.   No murmur heard. Pulmonary/Chest: Effort normal and breath sounds normal. No respiratory distress. He has no wheezes. He has no rales.  Abdominal: Soft. There is no tenderness.  Musculoskeletal: He exhibits no edema.  Neurological: He exhibits abnormal muscle tone.  Increased tone and fights passive ROM but no clear contractures  Psychiatric:  Resists exam--- grabs me forcefully and states "No" (his only word during the visit)          Assessment & Plan:

## 2014-07-21 NOTE — Assessment & Plan Note (Addendum)
Severe and end stage Not much change over past 2 months Total care  PPS- 20% Sleeps almost all the time. Almost no words Still hospice appropriate Wife has respite planned in 1 month for trip out of town

## 2014-07-21 NOTE — Assessment & Plan Note (Signed)
Mostly with care and in morning Does get the alprazolam in the AM and has for prn otherwise (but hasn't needed)

## 2014-07-26 ENCOUNTER — Other Ambulatory Visit: Payer: Self-pay | Admitting: Internal Medicine

## 2014-07-27 NOTE — Telephone Encounter (Signed)
Approved: #[id number] x 0 

## 2014-07-27 NOTE — Telephone Encounter (Signed)
rx called into pharmacy

## 2014-07-27 NOTE — Telephone Encounter (Signed)
02/11/14 

## 2014-07-30 DIAGNOSIS — I679 Cerebrovascular disease, unspecified: Secondary | ICD-10-CM | POA: Diagnosis not present

## 2014-07-30 DIAGNOSIS — G3109 Other frontotemporal dementia: Secondary | ICD-10-CM | POA: Diagnosis not present

## 2014-07-30 DIAGNOSIS — I6990 Unspecified sequelae of unspecified cerebrovascular disease: Secondary | ICD-10-CM | POA: Diagnosis not present

## 2014-07-31 ENCOUNTER — Other Ambulatory Visit: Payer: Self-pay | Admitting: Internal Medicine

## 2014-08-16 ENCOUNTER — Other Ambulatory Visit: Payer: Self-pay | Admitting: Internal Medicine

## 2014-08-17 NOTE — Discharge Summary (Signed)
PATIENT NAME:  Tanner Rosario, Tanner Rosario MR#:  [id number] DATE OF BIRTH:  02-Dec-1945  DATE OF ADMISSION:  11/20/2011 DATE OF DISCHARGE:  11/21/2011  PRIMARY CARE PHYSICIAN: Viviana Simpler, MD  DISCHARGE DIAGNOSES:  1. 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  2. Culture-negative urinary tract infection.  3. Possible left upper extremity cellulitis.  4. Frontotemporal dementia.  5. Gastroesophageal reflux disease.   IMAGING STUDIES: Chest x-ray showed no acute abnormalities.   Ultrasound of the upper extremities showed occlusive thrombus of the left brachial vein.   ADMITTING HISTORY AND PHYSICAL: Please see detailed history and physical dictated by Dr. Leslye Peer. In brief, this is a 69 year old male patient with history of frontoparietal dementia who had been complaining of left shoulder pain for the past four days and was brought into the Emergency Room after the patient was found to have fever. The patient was diagnosed with urinary tract infection with fevers, but also had incidental finding of left brachial vein deep vein thrombosis on venous Doppler. The patient was admitted for further management.   HOSPITAL COURSE: The patient was admitted to the telemetry floor and started on Xarelto after he received one dose of Lovenox. On further discussion with care management, the patient was not approved for Xarelto by his insurance and was switched to Lovenox and Coumadin and is being discharged home to follow-up with hospice with PT-INR check in two days and in five days. He will be on Lovenox for five days. This needs to be extended if his INR does not get therapeutic between 2 to 3. His Coumadin will be dosed by his primary care physician, Dr. Viviana Simpler.  For his urinary tract infection, cultures have been negative. The patient is being treated with ciprofloxacin which should also help his possible left upper extremity cellulitis which is more so likely secondary to his deep vein  thrombosis.   On the day of discharge, the patient is awake and alert, although confused. The daughter is at bedside. Vitals are in the normal range. He is afebrile and is being discharged home in a stable condition.   DISCHARGE MEDICATIONS:  1. Donepezil 5 mg oral at bedtime.  2. Keppra 250 mg twice a day.  3. Namenda 10 mg twice a day.  4. Nuedexta 20/10 mg one tablet twice a day.  5. Protonix 40 mg daily.  6. Senna Plus one tablet twice a day.  7. Tramadol 50 mg every 6 hours as needed for pain.  8. Trazodone 50 mg at bedtime.  9. Vitamin D 3000 international units daily.  10. Lovenox 100 mg subcutaneous twice a day for five days.  11. Coumadin 4 mg oral once a day.  12. Ciprofloxacin 500 mg oral twice a day for five days.  13. PT and INR check on 07/26 and 11/27/2011 with results sent to primary care physician.   DISCHARGE INSTRUCTIONS: Follow-up with hospice at home. The patient has been set up for a nurse visit to get his Lovenox shots for five days. Watch out for any bleeding and call your doctor or return to the Emergency Room. This plan was discussed with the patient and his daughter at bedside who verbalized understanding and are okay with the plan.   TIME SPENT: Time spent today on discharge dictation along with coordinating care and counseling of the patient was 35 minutes. ____________________________ Leia Alf Trenell Moxey, MD srs:slb D: 11/21/2011 14:55:15 ET     T: 11/21/2011 17:35:31 ET        JOB#:  [id number] cc: Noa Galvao R. Darvin Neighbours, MD, <Dictator> Venia Carbon, MD Neita Carp MD ELECTRONICALLY SIGNED 11/28/2011 13:49

## 2014-08-17 NOTE — H&P (Signed)
PATIENT NAME:  Tanner Rosario, Tanner Rosario MR#:  [id number] DATE OF BIRTH:  03-01-1946  DATE OF ADMISSION:  11/20/2011  PRIMARY CARE PHYSICIAN: Dr. Silvio Pate  CHIEF COMPLAINT: Left shoulder pain.   HISTORY OF PRESENT ILLNESS: This is a 69 year old man with history of frontal parietal dementia. He has been complaining of left shoulder pain since Friday. Was given Tylenol. The hospice nurse came out on Saturday. Tramadol was started at that time. He had a fever on Sunday and yesterday fever up to 102. Today they saw red streaks on his arm and hot to touch and was referred in to the Emergency Room for further evaluation. As per the patient's wife they did a urinalysis as outpatient that was positive and they were waiting on starting an antibiotic. In the Emergency Room, he was found to have an upper extremity occlusive thrombus in the left brachial vein. Urinalysis was negative. Hospitalist services were contacted for further evaluation.   PAST MEDICAL HISTORY:  1. Frontotemporal dementia. 2. Gastroesophageal reflux disease. 3. Hyperlipidemia. 4. Agitation.   PAST SURGICAL HISTORY:  1. Corneal transplant x3.  2. Back surgery x2.   ALLERGIES: Morphine.   MEDICATIONS:  1. Donepezil 5 mg at bedtime.  2. Keppra 250 mg twice a day. 3. Namenda 10 mg twice a day.  4. Nuedexta 20/10, 1 tablet twice a day. 5. Protonix 40 mg daily.  6. Senna + 1 tablet twice a day.  7. Tramadol 50 mg q.6h. as needed for pain. 8. Trazodone 50 mg at bedtime.  9. Vitamin D3 1000 international units daily.   SOCIAL HISTORY: Lives at home with 24/7 care and hospice following. Used to work as a Biomedical scientist, now bedbound with the frontoparietal dementia.   FAMILY HISTORY: Mother had dementia and lung cancer. Father died of metastatic lung cancer to the brain.   REVIEW OF SYSTEMS: Difficult to obtain secondary to frontoparietal dementia. Does answer some yes or no questions.   PHYSICAL EXAMINATION:  VITAL SIGNS:  Temperature 98.4, pulse 68, respirations 18, blood pressure 118/76, pulse oximetry 96%.   GENERAL: No respiratory distress.   EYES: Conjunctivae and lids normal. Pupils irregular, unable to test extraocular muscles.   EARS, NOSE, MOUTH, AND THROAT: Nasal mucosa no erythema. Throat no erythema. No exudate seen. Lips and gums no lesions.   NECK: No JVD. No bruits. No lymphadenopathy. No thyromegaly. No thyroid nodules palpated.   RESPIRATORY: Lungs clear to auscultation. No use of accessory muscles to breathe. No rhonchi, rales, or wheeze heard.   CARDIOVASCULAR: S1 and S2 normal. No gallops, rubs, or murmurs heard. Carotid upstroke 2+ bilaterally. No bruits.   EXTREMITIES: Dorsalis pedis pulses 2+ bilaterally. Trace edema of the lower extremity.   ABDOMEN: Soft, nontender. No organomegaly/splenomegaly. Normoactive bowel sounds. No masses felt.   LYMPHATIC: No lymph nodes in the neck.   MUSCULOSKELETAL: No clubbing, edema, or cyanosis.   SKIN: Erythema seen on the left wrist. Painful to palpation and movement.   NEUROLOGIC: Patient moves extremities on his own.   PSYCHIATRIC: Patient able to answer some very simple yes or no questions and follows some simple commands. Does get agitated when the nurse is around.   LABORATORY, DIAGNOSTIC AND RADIOLOGICAL DATA: INR 1, white blood cell count 9.4, hemoglobin and hematocrit 13.9 and 42.0, platelet count 234, glucose 104, BUN 11, creatinine 0.93, sodium 138, potassium 4.3, chloride 103, CO2 25, calcium 9.0. Liver function tests: Albumin low at 2.8. Ultrasound of the left upper extremity occlusive thrombus left brachial vein. Urinalysis  negative. Left shoulder complete negative. Humerus on the left negative.   ASSESSMENT AND PLAN:  1. Deep vein thrombosis left upper extremity. Since the patient is bedbound, may be a better candidate for anticoagulation. Because of his fronttemporal dementia may not have been a great candidate but since now he is  bedbound and too weak to get up the risk of fall would be less. He does have a hospital bed at home with hospice care. Will try to give Xarelto and see if that is approved. Patient was given one dose of Lovenox here.  2. Cellulitis with fever at home. Will give IV Ancef. Will also send off a urine culture to rule out a urine source. Will get a chest x-ray to rule out lung source. Blood cultures already sent off.  3. Frontotemporal dementia. He is on Donepezil, Namenda, Nuedexta, trazodone.   4. For the gastroesophageal reflux disease, he is on Protonix.   TIME SPENT ON ADMISSION: 55 minutes.   ____________________________ Tana Conch. Leslye Peer, MD rjw:cms D: 11/20/2011 22:36:53 ET T: 11/21/2011 07:09:03 ET JOB#: [id number]  cc: Tana Conch. Leslye Peer, MD, <Dictator> Venia Carbon, MD Marisue Brooklyn MD ELECTRONICALLY SIGNED 11/21/2011 13:08

## 2014-08-18 DIAGNOSIS — R531 Weakness: Secondary | ICD-10-CM | POA: Diagnosis not present

## 2014-08-22 NOTE — Consult Note (Signed)
PATIENT NAME:  Tanner Rosario, Tanner Rosario MR#:  [id number] DATE OF BIRTH:  [birth year]  DATE OF CONSULTATION:  07/05/2011  REFERRING PHYSICIAN:   CONSULTING PHYSICIAN:  Gonzella Lex, MD  IDENTIFYING INFORMATION AND REASON FOR CONSULT: 69 year old man brought into the hospital by his family because of worsening aggression and disorganized behavior at home. Consultation for assistance in managing worsening dementia and behavior problems.   HISTORY OF PRESENT ILLNESS: Information obtained from the patient, from the chart and from the patient's family, particularly his wife and daughter. The patient has been diagnosed with frontotemporal dementia for some years but the last few years had been reasonably stable to the point that he had been able to stay at home alone for extended periods of time. For the last few months, however, he has gotten acutely worse. Especially the last few weeks things have gotten much worse. He has started wandering away from home continually and is very difficult to redirect. At times he has been aggressive. Today, he was aggressive to his family. It required forced to keep him from wandering out into the street. He is not responding to verbal redirection apparently at home. It is not clear that anything particular has changed that would prompt this. Some adjustments have been made by his neurologist to medications, but those have acutely seemed like they may have helped. Today, the wife reports that he looked like he was weaker on his right side and was limping when he got up as well as being more confused and agitated. She says she called their neurologist who suggested he be brought to the Emergency Room. Wife feels that at this point his behavior has gotten to where he is no longer safe to be managed at home. She is working on getting them into [employer] and has an application in there, but does not have a clear timeline on that. He has been getting oral Seroquel outpatient.   PAST  PSYCHIATRIC HISTORY: As far as is known does not have any psychiatric history other than the frontotemporal dementia. He has been treated with Zoloft as an outpatient as well, presumably to try and control the dementia behavior. No known history of suicide or aggression or violent behavior in the past.   PAST MEDICAL HISTORY:  1. History of hyperlipidemia. 2. History of frontotemporal dementia of not completely specific etiology, possibly microvascular disease.  3. He has had corneal transplants and back surgery in the past.   SOCIAL HISTORY: The patient lives with his wife. He is retired as a Biomedical scientist. He has adult children in the area who are involved in his care. He does not have a history of substance abuse identified.   REVIEW OF SYSTEMS: The patient is not able to offer any specific complaints.   MENTAL STATUS EXAM: The patient was in the Emergency Room flat on his back, difficult to arouse. He would wake up and make brief eye contact but not consistently. He seemed to be trying to answer some questions, but I could not understand any of the words that he was saying. Not able to give any clear information. Did not get agitated or aggressive. When his wife came in to visit, he looked like he recognized her, but also did not say anything lucid that I could understand to her.   MEDICATIONS: According to the record I see here, he had been taking: 1. Zoloft 100 mg a day.  2. Namenda 10 mg at bedtime. 3. Vitamin D 1,000 international units per  day. 4. Zocor 20 mg a day. 5. Apparently, had recently been started on Seroquel low dose at home by his neurologist.   ALLERGIES: Morphine.   ASSESSMENT: This is a 69 year old gentleman with an established history of frontotemporal dementia who has been getting acutely worse. Appropriately, he was brought into the Emergency Room. From what I can see so far, lab tests have not shown any clear new etiology for this. His head CT done today is not  showing any clear new injury although it is possible that he could have microvascular changes that would not show up yet. I do not see a urinalysis back yet. No other clear labs abnormalities to explain the worsening confusion. The patient is appropriate for admission to the hospital. Clearly unsafe to be managed at home anymore.   TREATMENT PLAN: We are going to work on finding a way to get him to a geriatric psychiatry unit. We will refer him to the local geropsychiatric units. I have talked with the family about this. He is already under involuntary commitment petition. Meanwhile, I will continue psychiatric medicines that he has been on as well as put in an order for p.r.n. medications.   DIAGNOSIS PRINCIPLE AND PRIMARY:   AXIS I: Dementia of unclear etiology, possible frontotemporal type, vascular type.   SECONDARY DIAGNOSES:  AXIS I: No further.   AXIS II: No diagnosis.   AXIS III:  1. 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  2. Hyperlipidemia. 3. History of back surgery.   AXIS IV: Severe stress from worsening behavior problems that make it impossible for him to stay at home.   AXIS V: Functioning at time of evaluation, 20.   ____________________________ Gonzella Lex, MD jtc:ap D: 07/05/2011 17:34:05 ET T: 07/06/2011 07:04:42 ET JOB#: [id number]  cc: Gonzella Lex, MD, <Dictator> Gonzella Lex MD ELECTRONICALLY SIGNED 07/06/2011 8:43

## 2014-08-22 NOTE — Discharge Summary (Signed)
PATIENT NAME:  Tanner Rosario, Tanner Rosario MR#:  [id number] DATE OF BIRTH:  February 20, 1946  DATE OF ADMISSION:  06/18/2011 DATE OF DISCHARGE:  06/18/2011  PRIMARY CARE PHYSICIAN: Willene Hatchet, FNP   ADDENDUM: The patient will be discharged home from the Emergency Room with Home Health RN and physical therapy. No signs of infection, negative urinalysis, negative chest x-ray. White count is normal. With the findings on the CT scan, the family deferred MRI at this time, believed that this is a chronic issue with the dilated ventricles secondary to the frontotemporal dementia. Follow up with Neurology for this. Can consider olanzapine or a behavioral medication if needed as outpatient.   ____________________________ Tana Conch. Leslye Peer, MD rjw:cbb D: 06/18/2011 18:38:06 ET T: 06/19/2011 10:51:27 ET JOB#: [id number]  cc: Tana Conch. Leslye Peer, MD, <Dictator> Willene Hatchet, FNP Marisue Brooklyn MD ELECTRONICALLY SIGNED 06/19/2011 15:27

## 2014-08-22 NOTE — H&P (Signed)
PATIENT NAME:  Tanner Rosario, Tanner Rosario MR#:  [id number] DATE OF BIRTH:  Apr 26, 1946  DATE OF ADMISSION:  06/18/2011  PRIMARY CARE PHYSICIAN: Willene Hatchet, FNP overseen by Dr. Domenick Gong.   NEUROLOGIST: Dr. Jimmey Ralph at [facility]   CHIEF COMPLAINT: Behavioral disturbance the last three days.   HISTORY OF PRESENT ILLNESS: The patient is a 69 year old man with a frontotemporal dementia, hyperlipidemia. Over the last three days, as per the family, he has not been making much sense, putting on five shirts, getting up at night and going through the drawers. His walking has been unsteady, not putting feet in his pants, not eating as well, having some balance issues. In speaking with the family, his baseline is he plays cards and is able to know which card is higher than the other. He does watch a lot of TV. Normally he needs help putting on his pants, but he is able to feed himself if the food is in front of him, mimicking his wife's behavior while she eats.   PAST MEDICAL HISTORY:  1. Hyperlipidemia.  2. Frontotemporal dementia.   PAST SURGICAL HISTORY:  1. Corneal transplants x3.  2. Back surgery x2.   ALLERGIES: Morphine.   MEDICATIONS:  1. Zoloft 100 mg daily.  2. Namenda 10 mg at bedtime.  3. Vitamin D 1000 international units daily.  4. Zocor 20 mg daily.   SOCIAL HISTORY: Occasional wine or beer. No alcohol. No drug use. He used to work as a Biomedical scientist.  FAMILY HISTORY: Mother with dementia and lung cancer. Father died of metastatic lung cancer to the brain. History of heart disease and diabetes in the family.   REVIEW OF SYSTEMS: CONSTITUTIONAL: No fever, chills, or sweats. EYES: Difficult to test. The patient is unable to read the letters anymore with eye exams. EARS, NOSE, MOUTH, AND THROAT: Wife is unsure about hearing issues, but he does answer yes or no questions to me. No runny nose, no sore throat, no difficulty swallowing. CARDIOVASCULAR: No chest pain. No palpitations.  RESPIRATORY: No shortness breath, no cough. GASTROINTESTINAL: No nausea, no vomiting, no abdominal pain, no diarrhea. No constipation. No blood in the bowel movements. GENITOURINARY: No burning on urination or hematuria. MUSCULOSKELETAL: No joint pain or muscle pain. INTEGUMENT: No rashes or eruptions. NEUROLOGIC: No fainting or blackouts. PSYCHIATRIC: On medication for depression or behavioral disturbance. ENDOCRINE: No thyroid problems. HEMATOLOGIC/LYMPHATIC: No anemia, no easy bruising or bleeding.   PHYSICAL EXAMINATION:  VITAL SIGNS: Temperature 98.4, pulse 62, respirations 20, blood pressure 150/81, pulse oximetry 96%.   GENERAL: No respiratory distress.   HEENT: Eyes: Conjunctivae and lids are normal. Right pupil a little larger than the left pupil, slightly reactive to light. Ears, nose, mouth, and throat: Tympanic membranes no erythema. Nasal mucosa no erythema. Throat no erythema. No exudate seen. Lips and gums are normal.   NECK: No JVD. No bruits. No lymphadenopathy. No thyromegaly. No thyroid nodules palpated.   LUNGS: Lungs are clear to auscultation. No use of accessory muscles to breathe. No rhonchi, rales, or wheeze heard.   HEART: S1, S2 normal. No gallops, rubs, or murmurs heard. Carotid upstroke 2+ bilaterally. No bruits.   EXTREMITIES: Dorsalis pedis pulses 2+ bilaterally. Trace edema of the lower extremity.   ABDOMEN: Soft and nontender. No organomegaly/splenomegaly. Normoactive bowel sounds. No masses felt.   LYMPHATIC: No lymph nodes in the neck.   MUSCULOSKELETAL: Trace edema. No clubbing. No cyanosis.   SKIN: No ulcers or lesions seen.   NEUROLOGICAL: Cranial nerves  II through XII are grossly intact. Deep tendon reflexes are 2+ bilateral lower extremities. Babinski negative bilaterally. Power 5 out of 5 upper and lower extremities.   PSYCHIATRIC: The patient is oriented to person. He does follow commands and answers yes or no questions. How reliable are the yes and  no questions, I am not sure.   LABORATORY, DIAGNOSTIC AND RADIOLOGICAL DATA:  Urinalysis negative. CT scan of the head showed lateral and third ventricles are dilated, appear increased in size from prior MRI. Recommend correlation with outside prior imaging.  CBC still pending.  Glucose 122, BUN 17, creatinine 1.08, sodium 141, potassium 4.3, chloride 105, CO2 28, calcium 8.8. Liver function tests normal.  Troponin negative.  EKG: Normal sinus rhythm at 68 beats per minute. Left ventricular hypertrophy.   ASSESSMENT AND PLAN:  1. Altered mental status with behavioral change: This could be a functional decline with his history of the frontoparietal dementia. With the urinalysis negative and chest x-ray negative, I am still waiting for a white count to decide whether to admit as an observation or for discharge home. We will have to set up a Home Health aide, PT and RN, and a walker with a seat. The patient is already on Zoloft, [facility]. Can consider olanzapine as outpatient, but I suggest close clinical follow-up with Dr. Jimmey Ralph at [facility]. Final decision whether to admit or not will depend on the family's comfort on taking the patient home. In speaking with the family, they do not want him placed at this point.  2. Hyperlipidemia: Continue Zocor.       TIME SPENT ON CASE:   60 minutes.   ____________________________ Tana Conch. Leslye Peer, MD rjw:cbb D: 06/18/2011 17:46:32 ET T: 06/18/2011 18:18:20 ET JOB#: [id number]  cc: Tana Conch. Leslye Peer, MD, <Dictator> Willene Hatchet, FNP/Glenn Ilene Qua, MD Marisue Brooklyn MD ELECTRONICALLY SIGNED 06/19/2011 15:25
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
## 2014-08-23 DIAGNOSIS — F0390 Unspecified dementia without behavioral disturbance: Secondary | ICD-10-CM | POA: Diagnosis not present

## 2014-08-29 DIAGNOSIS — I679 Cerebrovascular disease, unspecified: Secondary | ICD-10-CM | POA: Diagnosis not present

## 2014-08-29 DIAGNOSIS — I6990 Unspecified sequelae of unspecified cerebrovascular disease: Secondary | ICD-10-CM | POA: Diagnosis not present

## 2014-08-29 DIAGNOSIS — G3109 Other frontotemporal dementia: Secondary | ICD-10-CM | POA: Diagnosis not present

## 2014-09-03 DIAGNOSIS — I6990 Unspecified sequelae of unspecified cerebrovascular disease: Secondary | ICD-10-CM | POA: Diagnosis not present

## 2014-09-03 DIAGNOSIS — I679 Cerebrovascular disease, unspecified: Secondary | ICD-10-CM | POA: Diagnosis not present

## 2014-09-03 DIAGNOSIS — G3109 Other frontotemporal dementia: Secondary | ICD-10-CM | POA: Diagnosis not present

## 2014-09-07 ENCOUNTER — Other Ambulatory Visit: Payer: Self-pay | Admitting: Internal Medicine

## 2014-09-28 ENCOUNTER — Other Ambulatory Visit: Payer: Self-pay | Admitting: Internal Medicine

## 2014-09-28 NOTE — Telephone Encounter (Signed)
Approved: #[id number] x 0 

## 2014-09-28 NOTE — Telephone Encounter (Signed)
07/27/2014 

## 2014-09-28 NOTE — Telephone Encounter (Signed)
rx called into pharmacy

## 2014-10-04 ENCOUNTER — Other Ambulatory Visit: Payer: Self-pay | Admitting: Internal Medicine

## 2014-10-06 ENCOUNTER — Encounter: Payer: Self-pay | Admitting: Internal Medicine

## 2014-10-06 ENCOUNTER — Ambulatory Visit: Admitting: Internal Medicine

## 2014-10-06 VITALS — BP 124/70 | HR 63 | Resp 16

## 2014-10-06 DIAGNOSIS — R062 Wheezing: Secondary | ICD-10-CM | POA: Insufficient documentation

## 2014-10-06 DIAGNOSIS — J9801 Acute bronchospasm: Secondary | ICD-10-CM | POA: Diagnosis not present

## 2014-10-06 DIAGNOSIS — I6339 Cerebral infarction due to thrombosis of other cerebral artery: Secondary | ICD-10-CM

## 2014-10-06 DIAGNOSIS — R451 Restlessness and agitation: Secondary | ICD-10-CM | POA: Diagnosis not present

## 2014-10-06 DIAGNOSIS — F0280 Dementia in other diseases classified elsewhere without behavioral disturbance: Secondary | ICD-10-CM

## 2014-10-06 DIAGNOSIS — I639 Cerebral infarction, unspecified: Secondary | ICD-10-CM

## 2014-10-06 DIAGNOSIS — G3109 Other frontotemporal dementia: Secondary | ICD-10-CM | POA: Diagnosis not present

## 2014-10-06 MED ORDER — LEVETIRACETAM 500 MG PO TABS: 500.0000 mg | ORAL_TABLET | Freq: Two times a day (BID) | ORAL | Status: DC

## 2014-10-06 MED ORDER — KETOCONAZOLE 2 % EX SHAM: MEDICATED_SHAMPOO | CUTANEOUS | Status: DC

## 2014-10-06 NOTE — Assessment & Plan Note (Signed)
Mostly with care Uses the alprazolam daily

## 2014-10-06 NOTE — Progress Notes (Signed)
Subjective:    Patient ID: Tanner Rosario, male    DOB: March 20, 1946, 69 y.o.   MRN: [id number]  HPI Home visit for follow up of dementia [name]--hospice RN Daughter, wife and caregiver here  Is being discharged from hospice due to stability. He continues with PPS of 34% and certainly still has a 6 month prognosis--but is still going.  Wife might be getting a friend to let her use a crank bed (will not need one supplied) He spends 20 hours a day or more in bed. His dementia has affected his swallowing and he needs to be kept elevated to at least 30 degrees to prevent aspiration. Bed wedges will not work as he slides off these. He requires frequent changes in body position which cannot be achieved in a regular bed. He is very heavy. They require a hoyer lift to allow transfers out of bed.  He needs a high back wheelchair for when he is out of bed. His dementia is severe and he is unable to perform daily activities of any type in his home. There is no way for his family to move him about in the house without a chair. The family and caregivers feed and to personal care for him while he is in the chair. He requires a reclining high back wheelchair due to postural instability and propensity to fall forward and dangerously extend his head.  Will need nebulizer for prn albuterol Has had some wheezing and respiratory difficulty in past May have been after possible aspiration Hasn't needed lately  Eating okay Still gets up in late morning--has breakfast. Will then be in recliner for a while--then back to chair to eat  Bowels move about every 2 days---difficult at times (especially if he goes when out of bed) Sleeps most of the time Still mostly agitated during any care--will occasionally tolerate brushing teeth and shaving  Current Outpatient Prescriptions on File Prior to Visit  Medication Sig Dispense Refill  . ALPRAZolam (XANAX) 0.5 MG tablet TAKE ONE TO TWO TABLETS TWICE A DAY PRIOR TO  MORNING AND EVENING CARE. MAY TAKE ONE TABLET THREE TIMES A DAY IF NEEDED. (Patient taking differently: TAKE ONE TO TWO TABLETS TWICE A DAY PRIOR TO MORNING  CARE. MAY TAKE ONE TABLET THREE TIMES A DAY IF NEEDED.) 60 tablet 0  . cholecalciferol (VITAMIN D) 1000 UNITS tablet Take 1,000 Units by mouth daily.    . hydrocortisone 2.5 % cream Apply topically 3 (three) times daily as needed. 30 g 5  . Infant Care Products (DERMACLOUD) CREA USE AS DIRECTED 430 g 11  . ketoconazole (NIZORAL) 2 % shampoo APPLY ONE APPLICATION AND LATHER LET REMAIN IN PLACE FOR FIVE MINUTESTHEN RINSE--REPEAT TWO TIMES A WEEK 120 mL 0  . SENNA S 8.6-50 MG per tablet TAKE ONE TABLET TWICE A DAY 100 tablet 1  . triamcinolone cream (KENALOG) 0.5 % Apply 1 application topically 2 (two) times daily as needed. 30 g 0   No current facility-administered medications on file prior to visit.    Allergies  Allergen Reactions  . Morphine And Related     Past Medical History  Diagnosis Date  . Frontotemporal dementia   . Hyperlipidemia   . CVA (cerebral infarction)   . Malnutrition   . Melanoma of back 1988    stage 3--removed and 6 month surveillance since then  . Agitation     Past Surgical History  Procedure Laterality Date  . Mri/mra  04/17/11    Normal at  [facility]  . Melanoma excision    . Back surgery      twice on low back  . Corneal transplant      needed 3 seperate times    Family History  Problem Relation Age of Onset  . Cancer Mother     lung  . Cancer Father     lung  . Heart disease Father   . Diabetes Sister   . Heart disease Paternal Uncle     all 5 uncles died of MI    History   Social History  . Marital Status: Married    Spouse Name: N/A  . Number of Children: 2  . Years of Education: N/A   Occupational History  . School Scientist, physiological     retired with illness   Social History Main Topics  . Smoking status: Former Research scientist (life sciences)  . Smokeless tobacco: Never Used     Comment: quit pipe ~1996  (rare even before this though)  . Alcohol Use: No     Comment: Occ social use or daily beer till sick  . Drug Use: Not on file  . Sexual Activity: Not on file   Other Topics Concern  . Not on file   Social History Narrative   2nd marriage for both   1 daughter and 1 stepdaughter   Wife holds health care POA   Has DNR order   Does not want hospitalization   Never discussed tube feedings   Review of Systems No skin ulcers or sig redness Continues to have obvious dental infections and breakage--but no symptoms No apparent arthritis pain    Objective:   Physical Exam  Constitutional: He appears well-developed and well-nourished. No distress.  Neck: No thyromegaly present.  Cardiovascular: Normal rate, regular rhythm and normal heart sounds.  Exam reveals no gallop.   No murmur heard. Pulmonary/Chest: Effort normal and breath sounds normal. No respiratory distress. He has no wheezes. He has no rales.  Musculoskeletal: He exhibits no edema or tenderness.  Lymphadenopathy:    He has no cervical adenopathy.  Neurological:  Mild stiffness  Psychiatric:  Michela Pitcher "How are you"--then didn't engage any more          Assessment & Plan:

## 2014-10-06 NOTE — Assessment & Plan Note (Signed)
Unclear etiology--- ?prior respiratory illness, ?silent aspiration Has the albuterol just in case

## 2014-10-06 NOTE — Assessment & Plan Note (Signed)
Severe and still appropriate for hospice but being discharged due to 3 year hospice presence Total care

## 2014-10-06 NOTE — Assessment & Plan Note (Signed)
Hard to tell how much this contributes to his condition

## 2014-10-08 ENCOUNTER — Other Ambulatory Visit: Payer: Self-pay | Admitting: Internal Medicine

## 2014-11-03 ENCOUNTER — Other Ambulatory Visit: Payer: Self-pay | Admitting: Internal Medicine

## 2014-11-26 ENCOUNTER — Other Ambulatory Visit: Payer: Self-pay | Admitting: Internal Medicine

## 2014-11-26 NOTE — Telephone Encounter (Signed)
rx called into pharmacy

## 2014-11-26 NOTE — Telephone Encounter (Signed)
Approved. Noted that she takes this for agitation PRN.

## 2014-11-26 NOTE — Telephone Encounter (Signed)
Last filled 09/28/2014 LETVAK PATIENT, Please send back to me for call in

## 2014-12-22 ENCOUNTER — Ambulatory Visit: Payer: Medicare Other | Admitting: Internal Medicine

## 2014-12-22 VITALS — BP 138/78 | HR 68 | Resp 14

## 2014-12-22 DIAGNOSIS — G3109 Other frontotemporal dementia: Secondary | ICD-10-CM

## 2014-12-22 DIAGNOSIS — L219 Seborrheic dermatitis, unspecified: Secondary | ICD-10-CM

## 2014-12-22 DIAGNOSIS — Z23 Encounter for immunization: Secondary | ICD-10-CM

## 2014-12-22 DIAGNOSIS — R451 Restlessness and agitation: Secondary | ICD-10-CM

## 2014-12-22 DIAGNOSIS — M24561 Contracture, right knee: Secondary | ICD-10-CM

## 2014-12-22 DIAGNOSIS — M24562 Contracture, left knee: Secondary | ICD-10-CM

## 2014-12-22 DIAGNOSIS — F0280 Dementia in other diseases classified elsewhere without behavioral disturbance: Secondary | ICD-10-CM

## 2014-12-23 ENCOUNTER — Encounter: Payer: Self-pay | Admitting: Internal Medicine

## 2014-12-23 ENCOUNTER — Telehealth: Payer: Self-pay | Admitting: Internal Medicine

## 2014-12-23 DIAGNOSIS — Z23 Encounter for immunization: Secondary | ICD-10-CM

## 2014-12-23 DIAGNOSIS — M24562 Contracture, left knee: Secondary | ICD-10-CM

## 2014-12-23 DIAGNOSIS — M24561 Contracture, right knee: Secondary | ICD-10-CM | POA: Insufficient documentation

## 2014-12-23 NOTE — Telephone Encounter (Signed)
Spouse brenda returned

## 2014-12-23 NOTE — Assessment & Plan Note (Signed)
Early  no apparent pain No Rx indicated

## 2014-12-23 NOTE — Assessment & Plan Note (Signed)
Mild increase in flaking along hairline (after considerable cleaning by wife and aides) Will add triamcinolone lotion--Rx written

## 2014-12-23 NOTE — Assessment & Plan Note (Signed)
This is better with his overall decline On keppra On once a day for alprazolam

## 2014-12-23 NOTE — Assessment & Plan Note (Signed)
Presumptive diagnosis made 2/13 This was confirmed after involuntary commitment to neuropsych unit at [facility] 5/13 He has continued to progress and is end stage now PPS-20% Still hospice appropriate but taken off due to relative stability Will ask them to come back in if he changes--like not eating well

## 2014-12-23 NOTE — Progress Notes (Signed)
Subjective:    Patient ID: Tanner Rosario, male    DOB: 1945-08-25, 69 y.o.   MRN: [id number]  HPI Home visit for follow up of chronic problems Wife and caregiver Brunetta Genera here No longer on hospice  Only able to afford crank bed Did finally get high backed wheelchair Out of bed once a day for 2 meals-- back to bed by 3 before aide leaves since wife can't transfer him alone She has had to cut back on aide time Now 9-3 and 5-10  Sleeps most of the time At most up 3 hours per day Incontinent of B & B--- will rattle bed at night if wet. Wife has to get up at least once nightly to change him  Not as agitated Only getting alprazolam in the AM--- not prn  Still on keppra which has worked as a mood stabilizer--he got worse off it  Constipated--going every 2 days but very hard Wife started miralax at pharmacist's suggestion This is working well at 1/2 capful daily  Current Outpatient Prescriptions on File Prior to Visit  Medication Sig Dispense Refill  . ALPRAZolam (XANAX) 0.5 MG tablet TAKE ONE TO TWO TABLETS TWICE A DAY PRIOR TO MORNING AND EVENING CARE. MAY TAKE ONE TABLET THREE TIMES A DAY IF NEEDED. 60 tablet 0  . cholecalciferol (VITAMIN D) 1000 UNITS tablet Take 1,000 Units by mouth daily.    . hydrocortisone 2.5 % cream Apply topically 3 (three) times daily as needed. 30 g 5  . Infant Care Products (DERMACLOUD) CREA USE AS DIRECTED 430 g 11  . ketoconazole (NIZORAL) 2 % shampoo APPLY ONE APPLICATION AND LATHER LET REMAIN IN PLACE FOR FIVE MINUTESTHEN RINSE--REPEAT TWO TIMES A WEEK 120 mL 11  . levETIRAcetam (KEPPRA) 500 MG tablet Take 1 tablet (500 mg total) by mouth 2 (two) times daily. 180 tablet 3  . SENNA S 8.6-50 MG per tablet TAKE ONE TABLET TWICE A DAY 180 tablet 3  . triamcinolone cream (KENALOG) 0.5 % Apply 1 application topically 2 (two) times daily as needed. 30 g 0   No current facility-administered medications on file prior to visit.    Allergies  Allergen  Reactions  . Morphine And Related     Past Medical History  Diagnosis Date  . Frontotemporal dementia   . Hyperlipidemia   . CVA (cerebral infarction)   . Malnutrition   . Melanoma of back 1988    stage 3--removed and 6 month surveillance since then  . Agitation     Past Surgical History  Procedure Laterality Date  . Mri/mra  04/17/11    Normal at [facility]  . Melanoma excision    . Back surgery      twice on low back  . Corneal transplant      needed 3 seperate times    Family History  Problem Relation Age of Onset  . Cancer Mother     lung  . Cancer Father     lung  . Heart disease Father   . Diabetes Sister   . Heart disease Paternal Uncle     all 5 uncles died of MI    Social History   Social History  . Marital Status: Married    Spouse Name: N/A  . Number of Children: 2  . Years of Education: N/A   Occupational History  . School Scientist, physiological     retired with illness   Social History Main Topics  . Smoking status: Former Research scientist (life sciences)  . Smokeless  tobacco: Never Used     Comment: quit pipe ~1996 (rare even before this though)  . Alcohol Use: No     Comment: Occ social use or daily beer till sick  . Drug Use: Not on file  . Sexual Activity: Not on file   Other Topics Concern  . Not on file   Social History Narrative   2nd marriage for both   1 daughter and 1 stepdaughter   Wife holds health care POA   Has DNR order   Does not want hospitalization   Never discussed tube feedings   Review of Systems Sleeps okay but does awaken if wet Seems to void okay Seborrhea is worse-- scaly and red on face despite nizoral shampoo Some redness at the corners of his mouth--discussed MVI with zinc Needs to be fed and wife holds drink.  Eating okay No choking or apparent swallowing problems Off nebulizer now--wife didn't replace. Does seem to be noisy at times in bed---sounds like upper airway noises    Objective:   Physical Exam  Constitutional: No distress.    Neck: No thyromegaly present.  Cardiovascular: Normal rate, regular rhythm and normal heart sounds.  Exam reveals no gallop.   No murmur heard. Pulmonary/Chest: Effort normal and breath sounds normal. No respiratory distress. He has no wheezes. He has no rales.  Abdominal: Soft. There is no tenderness.  Musculoskeletal: He exhibits no edema.  Early contractures in knees No obvious joint tenderness  Lymphadenopathy:    He has no cervical adenopathy.  Neurological:  Sleeping and doesn't engage--but not resistive Increased tone throughout  Skin: No rash noted. No erythema.          Assessment & Plan:

## 2014-12-23 NOTE — Telephone Encounter (Signed)
Tanner Rosario called Find out if she wants my home visit note mailed or she will pick up (she needs a copy of my last note)

## 2014-12-24 NOTE — Telephone Encounter (Signed)
I spoke to patient's wife yesterday and she wanted note mailed to her.  I mailed note to patient.

## 2014-12-25 NOTE — Telephone Encounter (Signed)
thanks

## 2015-01-26 ENCOUNTER — Other Ambulatory Visit: Payer: Self-pay | Admitting: Primary Care

## 2015-01-26 NOTE — Telephone Encounter (Signed)
Approved: #90 x 0 

## 2015-01-26 NOTE — Telephone Encounter (Signed)
Last prescribed on 11/26/14.

## 2015-01-26 NOTE — Telephone Encounter (Signed)
rx called into pharmacy

## 2015-02-09 ENCOUNTER — Other Ambulatory Visit: Payer: Self-pay | Admitting: *Deleted

## 2015-02-09 NOTE — Telephone Encounter (Signed)
Last office visit 12/22/2014.  Last refilled 10/02/2013 for 30 g with no refills.  Ok to refill?

## 2015-02-10 MED ORDER — TRIAMCINOLONE ACETONIDE 0.5 % EX CREA: 1.0000 "application " | TOPICAL_CREAM | Freq: Two times a day (BID) | CUTANEOUS | Status: DC | PRN

## 2015-03-02 ENCOUNTER — Ambulatory Visit: Payer: Medicare Other | Admitting: Internal Medicine

## 2015-03-02 ENCOUNTER — Encounter: Payer: Self-pay | Admitting: Internal Medicine

## 2015-03-02 VITALS — BP 132/84 | HR 60 | Resp 14

## 2015-03-02 DIAGNOSIS — G3109 Other frontotemporal dementia: Secondary | ICD-10-CM

## 2015-03-02 DIAGNOSIS — M24561 Contracture, right knee: Secondary | ICD-10-CM | POA: Diagnosis not present

## 2015-03-02 DIAGNOSIS — R532 Functional quadriplegia: Secondary | ICD-10-CM

## 2015-03-02 DIAGNOSIS — M24562 Contracture, left knee: Secondary | ICD-10-CM

## 2015-03-02 DIAGNOSIS — I6339 Cerebral infarction due to thrombosis of other cerebral artery: Secondary | ICD-10-CM

## 2015-03-02 DIAGNOSIS — F0280 Dementia in other diseases classified elsewhere without behavioral disturbance: Secondary | ICD-10-CM

## 2015-03-02 DIAGNOSIS — F39 Unspecified mood [affective] disorder: Secondary | ICD-10-CM | POA: Diagnosis not present

## 2015-03-02 NOTE — Assessment & Plan Note (Signed)
No functional use of arms or legs Needs to be fed, etc Lift for transfers

## 2015-03-02 NOTE — Progress Notes (Signed)
Subjective:    Patient ID: Tanner Rosario, male    DOB: [birth year], 69 y.o.   MRN: [id number]  HPI Home visit for follow up of severe dementia  Has continued to decline Has to be awoken at noon for breakfast--into wheelchair Transfer with lift and 2 people-- wife can only do this when aide is here Then back up for dinner Has aides 9-3PM and 5-10PM  Very hard to change his diaper--will take wife 30 minutes More nighttime incontinence--now bowel and bladder Wife has interrupted sleep--- but will try to nap at times to make up for it [place] she got to pay the caregiver overnight  Eating okay still Just the 2 meals when up out of bed Some trouble swallowing---has to go slow. Will chew at times Occasional coughing while eating--chokes when drinking at times Weight seems fairly stable  Not as agitated Gets alprazolam every morning--hasn't needed it after (sleeping mostly) Continues on keppra--as wean caused worsened agitation  Current Outpatient Prescriptions on File Prior to Visit  Medication Sig Dispense Refill  . ALPRAZolam (XANAX) 0.5 MG tablet TAKE ONE TO TWO TABLETS TWICE A DAY PRIOR TO MORNING AND EVENING CAREAND MAY TAKE ONE TABLET THREE TIMES A DAY ASNEEDED 90 tablet 0  . cholecalciferol (VITAMIN D) 1000 UNITS tablet Take 1,000 Units by mouth daily.    . hydrocortisone 2.5 % cream Apply topically 3 (three) times daily as needed. 30 g 5  . Infant Care Products (DERMACLOUD) CREA USE AS DIRECTED 430 g 11  . ketoconazole (NIZORAL) 2 % shampoo APPLY ONE APPLICATION AND LATHER LET REMAIN IN PLACE FOR FIVE MINUTESTHEN RINSE--REPEAT TWO TIMES A WEEK 120 mL 11  . levETIRAcetam (KEPPRA) 500 MG tablet Take 1 tablet (500 mg total) by mouth 2 (two) times daily. 180 tablet 3  . polyethylene glycol (MIRALAX / GLYCOLAX) packet Take 8.5 g by mouth daily as needed.     . SENNA S 8.6-50 MG per tablet TAKE ONE TABLET TWICE A DAY  180 tablet 3  . triamcinolone cream (KENALOG) 0.5 % Apply 1 application topically 2 (two) times daily as needed. 30 g 1   No current facility-administered medications on file prior to visit.    Allergies  Allergen Reactions  . Morphine And Related     Past Medical History  Diagnosis Date  . Frontotemporal dementia   . Hyperlipidemia   . CVA (cerebral infarction)   . Malnutrition   . Melanoma of back 1988    stage 3--removed and 6 month surveillance since then  . Agitation     Past Surgical History  Procedure Laterality Date  . Mri/mra  04/17/11    Normal at [facility]  . Melanoma excision    . Back surgery      twice on low back  . Corneal transplant      needed 3 seperate times    Family History  Problem Relation Age of Onset  . Cancer Mother     lung  . Cancer Father     lung  . Heart disease Father   . Diabetes Sister   . Heart disease Paternal Uncle     all 5 uncles died of MI    Social History   Social History  . Marital Status: Married    Spouse Name: N/A  . Number of Children: 2  . Years of Education: N/A   Occupational History  . Biomedical scientist  retired with illness   Social History Main Topics  . Smoking status: Former Research scientist (life sciences)  . Smokeless tobacco: Never Used     Comment: quit pipe ~1996 (rare even before this though)  . Alcohol Use: No     Comment: Occ social use or daily beer till sick  . Drug Use: Not on file  . Sexual Activity: Not on file   Other Topics Concern  . Not on file   Social History Narrative   2nd marriage for both   1 daughter and 1 stepdaughter   Wife holds health care POA   Has DNR order   Does not want hospitalization   Never discussed tube feedings    Review of Systems  Will have rare jabbering--but no understandable speech No apparent chest pain Some wheezing at times Right hand swelling 2 days ago--got better by the end of the day. Not lying on it or handing down. Didn't seem to be painful Bowels are  okay with senna--not needing the miralax daily No skin breakdown    Objective:   Physical Exam  Constitutional: No distress.  Neck: No thyromegaly present.  Cardiovascular: Normal rate, regular rhythm and normal heart sounds.  Exam reveals no gallop.   No murmur heard. Pulmonary/Chest: Effort normal and breath sounds normal. No respiratory distress. He has no wheezes. He has no rales.  Abdominal: Soft. There is no tenderness.  Lymphadenopathy:    He has no cervical adenopathy.  Neurological:  Doesn't really awaken--just withdraws and resists BP  Increased tone throughout  Skin: No rash noted.          Assessment & Plan:

## 2015-03-02 NOTE — Assessment & Plan Note (Signed)
And increased tone No obvious pain or skin breakdown

## 2015-03-02 NOTE — Assessment & Plan Note (Signed)
Agitation mostly which is some better Seems to have some anxiety as well keppra as mood stabilizer and alprazolam

## 2015-03-02 NOTE — Assessment & Plan Note (Signed)
Severe and still seems to be end stage though stable for some time Total care If starts losing weight--will get hospice back in Wife is managing with less aide help--but nights sound tough

## 2015-03-14 ENCOUNTER — Other Ambulatory Visit: Payer: Self-pay | Admitting: Internal Medicine

## 2015-04-13 ENCOUNTER — Other Ambulatory Visit: Payer: Self-pay | Admitting: Internal Medicine

## 2015-04-13 NOTE — Telephone Encounter (Signed)
Approved: #90 x 0 for alprazolam #430 x 11 for dermacloud

## 2015-04-13 NOTE — Telephone Encounter (Signed)
01/26/2015 

## 2015-04-13 NOTE — Telephone Encounter (Signed)
rx called into pharmacy

## 2015-04-27 ENCOUNTER — Other Ambulatory Visit: Payer: Self-pay | Admitting: Internal Medicine

## 2015-04-27 NOTE — Telephone Encounter (Signed)
Spoke with wife and the refill is not needed now, she was pulling med from a old bottle and she has a new bottle that was rx's 04/13/2015, I will cancel refill

## 2015-04-27 NOTE — Telephone Encounter (Signed)
Find out if she really needs it already Is he more agitated lately? Okay to fill if she needs them

## 2015-04-27 NOTE — Telephone Encounter (Signed)
04/13/2015, too soon?

## 2015-05-18 ENCOUNTER — Ambulatory Visit: Payer: Medicare Other | Admitting: Internal Medicine

## 2015-05-18 ENCOUNTER — Encounter: Payer: Self-pay | Admitting: Internal Medicine

## 2015-05-18 VITALS — BP 136/88 | HR 72 | Resp 18

## 2015-05-18 DIAGNOSIS — L219 Seborrheic dermatitis, unspecified: Secondary | ICD-10-CM | POA: Diagnosis not present

## 2015-05-18 DIAGNOSIS — R532 Functional quadriplegia: Secondary | ICD-10-CM | POA: Diagnosis not present

## 2015-05-18 DIAGNOSIS — F39 Unspecified mood [affective] disorder: Secondary | ICD-10-CM

## 2015-05-18 DIAGNOSIS — G3109 Other frontotemporal dementia: Secondary | ICD-10-CM | POA: Diagnosis not present

## 2015-05-18 DIAGNOSIS — F0280 Dementia in other diseases classified elsewhere without behavioral disturbance: Secondary | ICD-10-CM

## 2015-05-18 NOTE — Progress Notes (Signed)
Subjective:    Patient ID: Tanner Rosario, male    DOB: Sep 10, 1945, 70 y.o.   MRN: [id number]  HPI Wife and caregiver are here  He is about the same Sleeps all the time They awaken him at noon and they get him up for a while for breakfast Then back to bed and up again 5PM for dinner caregivers now 11-8PM He stays in bed when caregivers are not there (wife needs help getting him up)  No longer having agitation Discussed weaning meds  Wife still struggles getting him changed at night Takes a long time and still disrupts her sleep  Still eats well Weight seems to be stable  Current Outpatient Prescriptions on File Prior to Visit  Medication Sig Dispense Refill  . ALPRAZolam (XANAX) 0.5 MG tablet TAKE ONE TO TWO TABLETS TWICE A DAY PRIOR TO MORNING AND EVENING CAREAND MAY TAKE ONE TABLET THREE TIMES A DAY ASNEEDED 90 tablet 0  . cholecalciferol (VITAMIN D) 1000 UNITS tablet Take 1,000 Units by mouth daily.    . hydrocortisone 2.5 % cream Apply topically 3 (three) times daily as needed. 30 g 5  . Infant Care Products (DERMACLOUD) CREA USE AS DIRECTED 430 g 11  . ketoconazole (NIZORAL) 2 % shampoo APPLY ONE APPLICATION AND LATHER LET REMAIN IN PLACE FOR FIVE MINUTESTHEN RINSE--REPEAT TWO TIMES A WEEK 120 mL 11  . levETIRAcetam (KEPPRA) 500 MG tablet Take 1 tablet (500 mg total) by mouth 2 (two) times daily. 180 tablet 3  . polyethylene glycol (MIRALAX / GLYCOLAX) packet Take 8.5 g by mouth daily as needed.     . SENNA S 8.6-50 MG per tablet TAKE ONE TABLET TWICE A DAY 180 tablet 3  . triamcinolone cream (KENALOG) 0.5 % Apply 1 application topically 2 (two) times daily as needed. 30 g 1   No current facility-administered medications on file prior to visit.    Allergies  Allergen Reactions  . Morphine And Related     Past Medical History  Diagnosis Date  . Frontotemporal dementia   . Hyperlipidemia   . CVA (cerebral infarction)   . Malnutrition ([place])   . 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 Agitation     Past Surgical History  Procedure Laterality Date  . Mri/mra  04/17/11    Normal at [facility]  . Melanoma excision    . Back surgery      twice on low back  . Corneal transplant      needed 3 seperate times    Family History  Problem Relation Age of Onset  . Cancer Mother     lung  . Cancer Father     lung  . Heart disease Father   . Diabetes Sister   . Heart disease Paternal Uncle     all 5 uncles died of MI    Social History   Social History  . Marital Status: Married    Spouse Name: N/A  . Number of Children: 2  . Years of Education: N/A   Occupational History  . School Scientist, physiological     retired with illness   Social History Main Topics  . Smoking status: Former Research scientist (life sciences)  . Smokeless tobacco: Never Used     Comment: quit pipe ~1996 (rare even before this though)  . Alcohol Use: No     Comment: Occ social use or daily beer till sick  . Drug Use: Not  on file  . Sexual Activity: Not on file   Other Topics Concern  . Not on file   Social History Narrative   2nd marriage for both   1 daughter and 1 stepdaughter   Wife holds health care POA   Has DNR order   Does not want hospitalization   Never discussed tube feedings   Review of Systems  Wife still goes to FTD support group No skin breakdown Bowels are fairly regular--at least every 2 days Seems to void okay Will occasionally cough or choke on water--does better with juice. No significant problems with food--but will sometimes have trouble if not fed slowly No apparent pain     Objective:   Physical Exam  Constitutional: No distress.  Neck: Normal range of motion. Neck supple. No thyromegaly present.  Cardiovascular: Normal rate, regular rhythm and normal heart sounds.  Exam reveals no gallop.   No murmur heard. Pulmonary/Chest: Effort normal and breath sounds normal. No respiratory distress. He has no wheezes. He has  no rales.  Abdominal: Soft. There is no tenderness.  Musculoskeletal: He exhibits no edema.  Mild contractures in knees  Lymphadenopathy:    He has no cervical adenopathy.  Neurological:  Marked stiffness and increased tone. Mostly sleeping--brief periods with eyes open--but has "stare" that doesn't focus on anyone  Psychiatric:  No agitation          Assessment & Plan:

## 2015-05-18 NOTE — Assessment & Plan Note (Signed)
Certainly end stage Will ask hospice to come back if he stops eating Wife managing with decreased caregiver time

## 2015-05-18 NOTE — Assessment & Plan Note (Signed)
Not clear cut Agitation is not evident with disease progression Not agitated Will have wife stop the alprazolam---and then wean off the keppra also (used as mood stabilizer)

## 2015-05-18 NOTE — Assessment & Plan Note (Signed)
Bed and chair bound Lift for transfers No active movement that seems volitional---will grab with his hand during exam at times

## 2015-05-18 NOTE — Assessment & Plan Note (Signed)
Skin has been better

## 2015-07-20 ENCOUNTER — Ambulatory Visit: Payer: Medicare Other | Admitting: Internal Medicine

## 2015-07-20 ENCOUNTER — Encounter: Payer: Self-pay | Admitting: Internal Medicine

## 2015-07-20 VITALS — BP 140/82 | HR 72 | Resp 12

## 2015-07-20 DIAGNOSIS — F39 Unspecified mood [affective] disorder: Secondary | ICD-10-CM

## 2015-07-20 DIAGNOSIS — L219 Seborrheic dermatitis, unspecified: Secondary | ICD-10-CM

## 2015-07-20 DIAGNOSIS — G3109 Other frontotemporal dementia: Secondary | ICD-10-CM | POA: Diagnosis not present

## 2015-07-20 DIAGNOSIS — R532 Functional quadriplegia: Secondary | ICD-10-CM

## 2015-07-20 DIAGNOSIS — F0280 Dementia in other diseases classified elsewhere without behavioral disturbance: Secondary | ICD-10-CM

## 2015-07-20 MED ORDER — TRIAMCINOLONE ACETONIDE 0.5 % EX CREA: 1.0000 "application " | TOPICAL_CREAM | Freq: Two times a day (BID) | CUTANEOUS | Status: DC | PRN

## 2015-07-20 NOTE — Progress Notes (Signed)
Subjective:    Patient ID: Tanner Rosario, male    DOB: Jul 15, 1945, 70 y.o.   MRN: [id number]  HPI Home visit for follow up of severe dementia Caregiver Lelon Frohlich, wife and daughter here  He had worsening 5 days ago---they couldn't get him to swallow Then finally able to get 1 meal in him--ensure and soft food Sleeping almost the whole day--- wife let him sleep one day and he slept all the way to 5PM Finally got him up then---and able to get a little food in him only Wife works very hard to get 32 ounces of fluid in daily  Wife still has to get up at night to change his diapers--he shivers, etc --when he pees and she as to awaken to change him  No apparent choking or aspiration if fed very slow Occasionally coughs after drinking water though (?swallows too fast) She thickens the ensure with ice cream--for taste No apparent respiratory difficulty  Out of bed twice a day if they get him up at noon---then back to bed and back up ~5-7PM Still has to use the life Will move about with agitation but no functional movement  Still agitated at times Wife tried stopping the alprazolam---but things got worse She had to restart this in AM  Current Outpatient Prescriptions on File Prior to Visit  Medication Sig Dispense Refill  . cholecalciferol (VITAMIN D) 1000 UNITS tablet Take 1,000 Units by mouth daily.    . hydrocortisone 2.5 % cream Apply topically 3 (three) times daily as needed. 30 g 5  . Infant Care Products (DERMACLOUD) CREA USE AS DIRECTED 430 g 11  . ketoconazole (NIZORAL) 2 % shampoo APPLY ONE APPLICATION AND LATHER LET REMAIN IN PLACE FOR FIVE MINUTESTHEN RINSE--REPEAT TWO TIMES A WEEK 120 mL 11  . levETIRAcetam (KEPPRA) 500 MG tablet Take 1 tablet (500 mg total) by mouth 2 (two) times daily. 180 tablet 3  . polyethylene glycol (MIRALAX / GLYCOLAX) packet Take 8.5 g by mouth daily as needed.     . SENNA S 8.6-50 MG per tablet TAKE ONE TABLET TWICE A DAY 180 tablet 3  .  triamcinolone cream (KENALOG) 0.5 % Apply 1 application topically 2 (two) times daily as needed. 30 g 1   No current facility-administered medications on file prior to visit.    Allergies  Allergen Reactions  . Morphine And Related     Past Medical History  Diagnosis Date  . Frontotemporal dementia   . Hyperlipidemia   . CVA (cerebral infarction)   . Malnutrition ([place])   . Melanoma of back ([place]) 1988    stage 3--removed and 6 month surveillance since then  . Agitation     Past Surgical History  Procedure Laterality Date  . Mri/mra  04/17/11    Normal at [facility]  . Melanoma excision    . Back surgery      twice on low back  . Corneal transplant      needed 3 seperate times    Family History  Problem Relation Age of Onset  . Cancer Mother     lung  . Cancer Father     lung  . Heart disease Father   . Diabetes Sister   . Heart disease Paternal Uncle     all 5 uncles died of MI    Social History   Social History  . Marital Status: Married    Spouse Name: N/A  . Number of Children: 2  . Years of Education:  N/A   Occupational History  . School Scientist, physiological     retired with illness   Social History Main Topics  . Smoking status: Former Research scientist (life sciences)  . Smokeless tobacco: Never Used     Comment: quit pipe ~1996 (rare even before this though)  . Alcohol Use: No     Comment: Occ social use or daily beer till sick  . Drug Use: Not on file  . Sexual Activity: Not on file   Other Topics Concern  . Not on file   Social History Narrative   2nd marriage for both   1 daughter and 1 stepdaughter   Wife holds health care POA   Has DNR order   Does not want hospitalization   Never discussed tube feedings   Review of Systems Increased problems with his bowels--has had to be disimpacted at times, then loose at other times Voids okay No skin redness or open areas Face had acted up again with redness--almost like raw. And in scalp. Still using ketoconazole shampoo. Has  the steroid cream but may be running out    Objective:   Physical Exam  Constitutional: No distress.  Never wakes up and no active engagement or speech  Neck: No thyromegaly present.  Cardiovascular: Normal rate, regular rhythm and normal heart sounds.  Exam reveals no gallop.   No murmur heard. Pulmonary/Chest: Effort normal and breath sounds normal. No respiratory distress. He has no wheezes. He has no rales.  Abdominal: Soft. There is no tenderness.  Musculoskeletal: He exhibits no edema.  Lymphadenopathy:    He has no cervical adenopathy.  Neurological:  Increased tone in all extremeties Very little active movement  Skin: No rash noted.          Assessment & Plan:

## 2015-07-20 NOTE — Assessment & Plan Note (Signed)
No functional movement Lift for transfers Total care

## 2015-07-20 NOTE — Assessment & Plan Note (Signed)
Agitated again when off the alprazolam Wife has restarted in AM Asked her to try 0.25mg  and double if needed

## 2015-07-20 NOTE — Assessment & Plan Note (Signed)
Severe and clearly worse with trouble waking him at all and less oral intake Discussed trying to get him up by noon Try 1 soft food meal and otherwise ensure/boost Wife needs some respite but she is reluctant to hire more help Clearly hospice appropriate--but wife wants to hold off on reconsulting them

## 2015-07-20 NOTE — Assessment & Plan Note (Signed)
Had a recent flare-- discussed that with prognosis, no reason not to use the TAC on his face

## 2015-08-02 ENCOUNTER — Other Ambulatory Visit: Payer: Self-pay | Admitting: Internal Medicine

## 2015-08-02 NOTE — Telephone Encounter (Signed)
It says last refill 60g on 03-30-14. I could not find it in her history of medications. Last OV 07-20-15

## 2015-08-02 NOTE — Telephone Encounter (Signed)
Approved: okay #60 gm x 11

## 2015-09-07 ENCOUNTER — Other Ambulatory Visit: Payer: Self-pay | Admitting: Internal Medicine

## 2015-09-07 NOTE — Telephone Encounter (Signed)
Gave verbal order to Vivien Rota at the pharmacy

## 2015-09-07 NOTE — Telephone Encounter (Signed)
Last filled 04-13-15 #90 Last OV 07-21-15 Next OV not scheduled

## 2015-09-07 NOTE — Telephone Encounter (Signed)
Approved: #90 x 0 

## 2015-10-05 ENCOUNTER — Non-Acute Institutional Stay: Payer: Medicare Other | Admitting: Internal Medicine

## 2015-10-05 ENCOUNTER — Encounter: Payer: Self-pay | Admitting: Internal Medicine

## 2015-10-05 VITALS — BP 136/76 | HR 66 | Resp 12

## 2015-10-05 DIAGNOSIS — F39 Unspecified mood [affective] disorder: Secondary | ICD-10-CM | POA: Diagnosis not present

## 2015-10-05 DIAGNOSIS — F0280 Dementia in other diseases classified elsewhere without behavioral disturbance: Secondary | ICD-10-CM

## 2015-10-05 DIAGNOSIS — M24561 Contracture, right knee: Secondary | ICD-10-CM | POA: Diagnosis not present

## 2015-10-05 DIAGNOSIS — G3109 Other frontotemporal dementia: Secondary | ICD-10-CM

## 2015-10-05 DIAGNOSIS — R532 Functional quadriplegia: Secondary | ICD-10-CM

## 2015-10-05 DIAGNOSIS — M24562 Contracture, left knee: Secondary | ICD-10-CM

## 2015-10-05 DIAGNOSIS — L219 Seborrheic dermatitis, unspecified: Secondary | ICD-10-CM

## 2015-10-05 MED ORDER — ALPRAZOLAM 0.5 MG PO TABS: ORAL_TABLET | ORAL | Status: DC

## 2015-10-05 NOTE — Assessment & Plan Note (Signed)
Agitated at times still Doing okay with just the AM alprazolam

## 2015-10-05 NOTE — Assessment & Plan Note (Signed)
Does okay with his topical Rx

## 2015-10-05 NOTE — Assessment & Plan Note (Signed)
Lift for transfers Needs to be fed No functional movement--except occ grabbing during care

## 2015-10-05 NOTE — Assessment & Plan Note (Signed)
Was briefly worse--but seems stable again Still severely affected Total care--bed and chair bound (with lift)

## 2015-10-05 NOTE — Assessment & Plan Note (Signed)
Tone increased all over  Able to extend with effort and patience

## 2015-10-05 NOTE — Progress Notes (Signed)
Subjective:    Patient ID: Tanner Rosario, male    DOB: 1945/07/07, 70 y.o.   MRN: [id number]  HPI Home visit for follow up of severe dementia Wife is here  Still sleeping most of the time If he stays up, they can get him to eat --but still a struggle At times, he is stiff and hard to turn to change Still gets gives AM alprazolam---didn't tolerate not getting  Still getting him up twice a day-- 11AM to 2PM or so Then again for supper Lift for transfers Aides here from 9AM till 8PM  Swallowing better again Prior problems seem less noticeable Accepting fluids also--though close his mouth at times and not take at times  Rare cough No SOB Still no functional movement  Current Outpatient Prescriptions on File Prior to Visit  Medication Sig Dispense Refill  . cholecalciferol (VITAMIN D) 1000 UNITS tablet Take 1,000 Units by mouth daily.    . Infant Care Products (DERMACLOUD) CREA USE AS DIRECTED 430 g 11  . ketoconazole (NIZORAL) 2 % shampoo APPLY ONE APPLICATION AND LATHER LET REMAIN IN PLACE FOR FIVE MINUTESTHEN RINSE--REPEAT TWO TIMES A WEEK 120 mL 11  . levETIRAcetam (KEPPRA) 500 MG tablet Take 1 tablet (500 mg total) by mouth 2 (two) times daily. 180 tablet 3  . nystatin (MYCOSTATIN) powder APPLY TO AFFECTED AREAS ONE TO TWO TIMESDAILY 60 g 11  . SENNA S 8.6-50 MG per tablet TAKE ONE TABLET TWICE A DAY (Patient taking differently: TAKE ONE TABLET TWICE A DAY AS NEEDED) 180 tablet 3  . triamcinolone cream (KENALOG) 0.5 % Apply 1 application topically 2 (two) times daily as needed. 30 g 3   No current facility-administered medications on file prior to visit.    Allergies  Allergen Reactions  . Morphine And Related     Past Medical History  Diagnosis Date  . Frontotemporal dementia   . Hyperlipidemia   . CVA (cerebral infarction)   . Malnutrition ([place])   . Melanoma of back ([place]) 1988    stage 3--removed and 6 month surveillance since then  . Agitation     Past  Surgical History  Procedure Laterality Date  . Mri/mra  04/17/11    Normal at [facility]  . Melanoma excision    . Back surgery      twice on low back  . Corneal transplant      needed 3 seperate times    Family History  Problem Relation Age of Onset  . Cancer Mother     lung  . Cancer Father     lung  . Heart disease Father   . Diabetes Sister   . Heart disease Paternal Uncle     all 5 uncles died of MI    Social History   Social History  . Marital Status: Married    Spouse Name: N/A  . Number of Children: 2  . Years of Education: N/A   Occupational History  . School Scientist, physiological     retired with illness   Social History Main Topics  . Smoking status: Former Research scientist (life sciences)  . Smokeless tobacco: Never Used     Comment: quit pipe ~1996 (rare even before this though)  . Alcohol Use: No     Comment: Occ social use or daily beer till sick  . Drug Use: Not on file  . Sexual Activity: Not on file   Other Topics Concern  . Not on file   Social History Narrative   2nd  marriage for both   1 daughter and 1 stepdaughter   Wife holds health care POA   Has DNR order   Does not want hospitalization   Never discussed tube feedings   Review of Systems He still wakes wife (or she is aware)when he voids at night--takes a while to change (usually twice a night) 2 meals a day No apparent pain---though he still shivers when he voids (urine is clear and doesn't smell) Bowels are fine--- tid at times (discussed only giving senna if no stool in a day) Had some scrotal redness last week--he clenches legs together---better with the dermacloud No ulcers    Objective:   Physical Exam  Constitutional: He appears well-developed. No distress.  Neck: No thyromegaly present.  Cardiovascular: Normal rate, regular rhythm and normal heart sounds.  Exam reveals no gallop.   No murmur heard. Pulmonary/Chest: Effort normal and breath sounds normal. No respiratory distress. He has no wheezes. He has  no rales.  Abdominal: Soft. There is no tenderness.  Musculoskeletal: He exhibits no edema.  Lymphadenopathy:    He has no cervical adenopathy.  Neurological:  Awake briefly ---looked at me and stated 2 mumbled words. Then slept the rest of the visit. Markedly increased tone No tremor  Skin: No rash noted.  Psychiatric:  Sleeping Not agitated          Assessment & Plan:

## 2015-10-22 ENCOUNTER — Other Ambulatory Visit: Payer: Self-pay | Admitting: Internal Medicine

## 2015-12-14 ENCOUNTER — Encounter: Payer: Self-pay | Admitting: Internal Medicine

## 2015-12-14 ENCOUNTER — Ambulatory Visit: Payer: Medicare Other | Admitting: Internal Medicine

## 2015-12-14 DIAGNOSIS — R062 Wheezing: Secondary | ICD-10-CM | POA: Diagnosis not present

## 2015-12-14 DIAGNOSIS — F39 Unspecified mood [affective] disorder: Secondary | ICD-10-CM | POA: Diagnosis not present

## 2015-12-14 DIAGNOSIS — G3109 Other frontotemporal dementia: Secondary | ICD-10-CM | POA: Diagnosis not present

## 2015-12-14 DIAGNOSIS — F0280 Dementia in other diseases classified elsewhere without behavioral disturbance: Secondary | ICD-10-CM

## 2015-12-14 DIAGNOSIS — R532 Functional quadriplegia: Secondary | ICD-10-CM

## 2015-12-14 NOTE — Assessment & Plan Note (Signed)
I suspect this is upper airway and not related to lungs

## 2015-12-14 NOTE — Assessment & Plan Note (Signed)
Severe and end stage but stable Hospice eligible from functional standpoint but no recent decline Wife is having a hard time emotionally Has grant again to have weekend away

## 2015-12-14 NOTE — Progress Notes (Signed)
Subjective:    Patient ID: Tanner Rosario, male    DOB: 04/29/1946, 69 y.o.   MRN: [id number]  HPI Home visit for review of chronic health issues Wife and aide here  Things are about the same Still up twice with lift-- 11-2PM and 5:30- 7:30 Aides 9AM to [place] help bathing him and she needs help to get him up--even with the lift Not really agitated --still getting 1/2 alprazolam in AM  Still sleeps most of the day--even when up in chair Stiffens up with care Swallowing okay--no choking. He is pocketing food though. Has to be fed very slow.  Wife still has hard time changing him at night Will take a long time Mostly voids--occasional BM at night also  Current Outpatient Prescriptions on File Prior to Visit  Medication Sig Dispense Refill  . ALPRAZolam (XANAX) 0.5 MG tablet 0.25mg  in AM 1 tablet 0  . cholecalciferol (VITAMIN D) 1000 UNITS tablet Take 1,000 Units by mouth daily.    . Infant Care Products (DERMACLOUD) CREA USE AS DIRECTED 430 g 11  . ketoconazole (NIZORAL) 2 % shampoo APPLY ONE APPLICATION AND LATHER LET REMAIN IN PLACE FOR FIVE MINUTESTHEN RINSE--REPEAT TWO TIMES A WEEK 120 mL 11  . levETIRAcetam (KEPPRA) 500 MG tablet TAKE ONE TABLET TWICE A DAY 180 tablet 3  . nystatin (MYCOSTATIN) powder APPLY TO AFFECTED AREAS ONE TO TWO TIMESDAILY 60 g 11  . SENNALAX-S 8.6-50 MG tablet TAKE ONE TABLET TWICE A DAY 100 tablet 3  . triamcinolone cream (KENALOG) 0.5 % Apply 1 application topically 2 (two) times daily as needed. 30 g 3   No current facility-administered medications on file prior to visit.     Allergies  Allergen Reactions  . Morphine And Related     Past Medical History:  Diagnosis Date  . Agitation   . CVA (cerebral infarction)   . Frontotemporal dementia   . Hyperlipidemia   . Malnutrition ([place])   . Melanoma of back ([place]) 1988   stage 3--removed and 6 month surveillance since then    Past Surgical History:  Procedure Laterality Date  . BACK  SURGERY     twice on low back  . CORNEAL TRANSPLANT     needed 3 seperate times  . MELANOMA EXCISION    . MRI/MRA  04/17/11   Normal at [facility] History  Problem Relation Age of Onset  . Cancer Mother     lung  . Cancer Father     lung  . Heart disease Father   . Diabetes Sister   . Heart disease Paternal Uncle     all 5 uncles died of MI    Social History   Social History  . Marital status: Married    Spouse name: N/A  . Number of children: 2  . Years of education: N/A   Occupational History  . School Scientist, physiological     retired with illness   Social History Main Topics  . Smoking status: Former Research scientist (life sciences)  . Smokeless tobacco: Never Used     Comment: quit pipe ~1996 (rare even before this though)  . Alcohol use No     Comment: Occ social use or daily beer till sick  . Drug use: Unknown  . Sexual activity: Not on file   Other Topics Concern  . Not on file   Social History Narrative   2nd marriage for both   1 daughter and 1 stepdaughter   Wife holds  health care POA   Has DNR order   Does not want hospitalization   Never discussed tube feedings   Review of Systems Doesn't seem to have lost weight Drinks well No cough Still has wheezing--but no labored breathing. No relationship to eating (only happens in bed) No skin breakdown No apparent joint pain Bowels are frequent--still on the senna No seizures Still seems to get more stiff and make noises when voiding    Objective:   Physical Exam  Constitutional: No distress.  Neck: No thyromegaly present.  Cardiovascular: Normal rate, regular rhythm and normal heart sounds.  Exam reveals no gallop.   No murmur heard. Pulmonary/Chest: Effort normal and breath sounds normal. No respiratory distress. He has no wheezes. He has no rales.  Abdominal: Soft. There is no tenderness.  Musculoskeletal: He exhibits no edema.  Lymphadenopathy:    He has no cervical adenopathy.  Neurological:  Basically  unresponsive. Just stiffens making BP check difficult  Skin: No rash noted.  Psychiatric:  No agitation while I was there          Assessment & Plan:

## 2015-12-14 NOTE — Assessment & Plan Note (Signed)
Not as agitated Wife will see if he is okay with care without the alprazolam keppra is as mood stabilizer--will continue for now

## 2015-12-14 NOTE — Assessment & Plan Note (Signed)
Total care Lift with 2 people to transfer Still eats when fed

## 2016-01-27 ENCOUNTER — Other Ambulatory Visit: Payer: Self-pay | Admitting: Internal Medicine

## 2016-02-15 ENCOUNTER — Ambulatory Visit: Payer: Medicare Other | Admitting: Internal Medicine

## 2016-02-15 ENCOUNTER — Encounter: Payer: Self-pay | Admitting: Internal Medicine

## 2016-02-15 VITALS — BP 128/80 | HR 60 | Resp 16

## 2016-02-15 DIAGNOSIS — R532 Functional quadriplegia: Secondary | ICD-10-CM | POA: Diagnosis not present

## 2016-02-15 DIAGNOSIS — F0280 Dementia in other diseases classified elsewhere without behavioral disturbance: Secondary | ICD-10-CM

## 2016-02-15 DIAGNOSIS — G3109 Other frontotemporal dementia: Secondary | ICD-10-CM | POA: Diagnosis not present

## 2016-02-15 DIAGNOSIS — Z23 Encounter for immunization: Secondary | ICD-10-CM | POA: Diagnosis not present

## 2016-02-15 DIAGNOSIS — L219 Seborrheic dermatitis, unspecified: Secondary | ICD-10-CM | POA: Diagnosis not present

## 2016-02-15 DIAGNOSIS — F39 Unspecified mood [affective] disorder: Secondary | ICD-10-CM

## 2016-02-15 NOTE — Assessment & Plan Note (Signed)
Severe but stable Total care Still eating well No skin issues Discussed respite with wife

## 2016-02-15 NOTE — Assessment & Plan Note (Signed)
They will continue to get him up twice a day Lift and 1-2 people for transfers

## 2016-02-15 NOTE — Assessment & Plan Note (Signed)
Doesn't seem to have a significant change off the alprazolam Will continue the keppra as it seems to stabilize his mood

## 2016-02-15 NOTE — Progress Notes (Signed)
Subjective:    Patient ID: Tanner Rosario, male    DOB: 1946-01-30, 70 y.o.   MRN: [id number]  HPI Home visit for follow up of severe dementia Wife and aide, Lelon Frohlich are here  He continues to eat okay Total care including feeding  Did take him off the alprazolam 2 of the caregivers see more agitation or fidgeting Wife and the other aide haven't had problems with this (does oppose care at times but not threatening)  Will have 3-5 stools a day at times Now back to 2/day mostly Discussed holding the senna if he is going a lot again Still has behaviors when voiding--- will shudder and try to hold himself Does seem to empty bladder okay Strong odor at times--wife will try to give more fluids if that happens  Gets AM care for aide--cleaning, etc Will finally seem to be awakening by about 11AM Then up to recliner to eat Back to bed for a while then up again for supper Wife still needs help with lift--some of the aides can do it alone  No regular issues with mood Wife finds that if she waits when he resists care, he usually can be coaxed for things (like getting him dressed) Continues on the keppra No seizures though--for mood stabilization  Wife did get away for 3 nights to [place] to pay for the aides128/80 Did some hiking and really enjoyed this  Current Outpatient Prescriptions on File Prior to Visit  Medication Sig Dispense Refill  . cholecalciferol (VITAMIN D) 1000 UNITS tablet Take 1,000 Units by mouth daily.    . Infant Care Products (DERMACLOUD) CREA USE AS DIRECTED 430 g 5  . ketoconazole (NIZORAL) 2 % shampoo APPLY ONE APPLICATION AND LATHER LET REMAIN IN PLACE FOR FIVE MINUTESTHEN RINSE--REPEAT TWO TIMES A WEEK 120 mL 11  . levETIRAcetam (KEPPRA) 500 MG tablet TAKE ONE TABLET TWICE A DAY 180 tablet 3  . nystatin (MYCOSTATIN) powder APPLY TO AFFECTED AREAS ONE TO TWO TIMESDAILY 60 g 11  . SENNALAX-S 8.6-50 MG tablet TAKE ONE TABLET TWICE A DAY 100  tablet 3  . triamcinolone cream (KENALOG) 0.5 % Apply 1 application topically 2 (two) times daily as needed. 30 g 3   No current facility-administered medications on file prior to visit.     Allergies  Allergen Reactions  . Morphine And Related     Past Medical History:  Diagnosis Date  . Agitation   . CVA (cerebral infarction)   . Frontotemporal dementia   . Hyperlipidemia   . Malnutrition ([place])   . 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 BACK SURGERY     twice on low back  . CORNEAL TRANSPLANT     needed 3 seperate times  . MELANOMA EXCISION    . MRI/MRA  04/17/11   Normal at [facility] History  Problem Relation Age of Onset  . Cancer Mother     lung  . Cancer Father     lung  . Heart disease Father   . Diabetes Sister   . Heart disease Paternal Uncle     all 5 uncles died of MI    Social History   Social History  . Marital status: Married    Spouse name: N/A  . Number of children: 2  . Years of education: N/A   Occupational History  .  School Scientist, physiological     retired with illness   Social History Main Topics  . Smoking status: Former Research scientist (life sciences)  . Smokeless tobacco: Never Used     Comment: quit pipe ~1996 (rare even before this though)  . Alcohol use No     Comment: Occ social use or daily beer till sick  . Drug use: Unknown  . Sexual activity: Not on file   Other Topics Concern  . Not on file   Social History Narrative   2nd marriage for both   1 daughter and 1 stepdaughter   Wife holds health care POA   Has DNR order   Does not want hospitalization   Never discussed tube feedings   Review of Systems Occasional rashes-- still has creams. Friend gave miconazole cream also (can use instead of the dermacloud) Did see 19 year old granddaughter--- called her "Jenny Reichmann" (his daughter whom she looks like) Sleeps most of the time No ulcers Still  has intermittent wheezing at night--but no breathing difficulty    Objective:   Physical Exam  Constitutional: He appears well-nourished. No distress.  Neck: No thyromegaly present.  Cardiovascular: Normal rate, regular rhythm and normal heart sounds.  Exam reveals no gallop.   No murmur heard. Pulmonary/Chest: Effort normal and breath sounds normal. No respiratory distress. He has no wheezes. He has no rales.  Abdominal: Soft. There is no tenderness.  Lymphadenopathy:    He has no cervical adenopathy.  Neurological:  Doesn't really wake up for visit Does move his arm some but no engagement Still with increased tone  Skin: No rash noted.  Psychiatric:  Calm today--no agitation          Assessment & Plan:

## 2016-02-15 NOTE — Addendum Note (Signed)
Addended by: Pilar Grammes on: 02/15/2016 03:09 PM   Modules accepted: Orders

## 2016-02-15 NOTE — Assessment & Plan Note (Signed)
Does okay with the cream

## 2016-04-09 ENCOUNTER — Other Ambulatory Visit: Payer: Self-pay

## 2016-04-09 MED ORDER — VALACYCLOVIR HCL 1 G PO TABS: 1000.0000 mg | ORAL_TABLET | Freq: Three times a day (TID) | ORAL | 0 refills | Status: DC

## 2016-04-09 NOTE — Telephone Encounter (Signed)
Pt's wife sent Dr Silvio Pate a picture of a rash the pt has. Dr Silvio Pate says it is Shingles. He has authorized a prescription for Valacyclovir 1gm tid for 1 week with 1 refill.  Verified with wife which pharmacy to send the rx. She confirmed [facility]. Rx has been sent.

## 2016-05-02 ENCOUNTER — Ambulatory Visit: Payer: Medicare Other | Admitting: Internal Medicine

## 2016-05-02 ENCOUNTER — Encounter: Payer: Self-pay | Admitting: Internal Medicine

## 2016-05-02 VITALS — BP 142/80 | HR 60 | Resp 16

## 2016-05-02 DIAGNOSIS — F39 Unspecified mood [affective] disorder: Secondary | ICD-10-CM

## 2016-05-02 DIAGNOSIS — R532 Functional quadriplegia: Secondary | ICD-10-CM | POA: Diagnosis not present

## 2016-05-02 DIAGNOSIS — F0280 Dementia in other diseases classified elsewhere without behavioral disturbance: Secondary | ICD-10-CM

## 2016-05-02 DIAGNOSIS — G3109 Other frontotemporal dementia: Secondary | ICD-10-CM | POA: Diagnosis not present

## 2016-05-02 DIAGNOSIS — L219 Seborrheic dermatitis, unspecified: Secondary | ICD-10-CM | POA: Diagnosis not present

## 2016-05-02 MED ORDER — KETOCONAZOLE 2 % EX SHAM: MEDICATED_SHAMPOO | CUTANEOUS | 11 refills | Status: DC

## 2016-05-02 MED ORDER — KETOCONAZOLE 2 % EX CREA: 1.0000 "application " | TOPICAL_CREAM | Freq: Two times a day (BID) | CUTANEOUS | 11 refills | Status: DC | PRN

## 2016-05-02 NOTE — Assessment & Plan Note (Addendum)
Gets mildly agitated with care at times Still okay without meds The keppra may act as a mood stabilizer so will continue

## 2016-05-02 NOTE — Progress Notes (Signed)
Subjective:    Patient ID: Tanner Rosario, male    DOB: 1946-01-28, 71 y.o.   MRN: [id number]  HPI Home visit for follow up of dementia and other medical conditions  Reviewed the apparent shingles She had sent me a pictures of what looked like shingles Mostly on right side but some lesions crossed to the left Wouldn't open his eyes for a while Face was puffy  Rash mostly cleared but ongoing eye problems Still with some redness on face She tried holding the ketoconazole shampoo--in case that was causing the problem  Rubbing his eyes a lot Did get erythro eye ointment twice from the eye doctor--his eye finally seems to look better  Sleeps now at least 20 hours a day Up around noon and eats--back at 3PM Up again at 5 and back to bed by 7PM Lift and maximal work of 2 for transfers (some of the aides can do it alone though)  No cough or apparent breathing problems Some sneezing--especially in the morning  No other skin issues  Still has aides--has new one who is from [facility] (part time)----Sunday During the week-- 9AM- 8PM Does have help every day Wife does need to change him twice at night--takes a long time (trouble moving him) She is "okay"---- "I got through the holidays"  Current Outpatient Prescriptions on File Prior to Visit  Medication Sig Dispense Refill  . cholecalciferol (VITAMIN D) 1000 UNITS tablet Take 1,000 Units by mouth daily.    . Infant Care Products (DERMACLOUD) CREA USE AS DIRECTED 430 g 5  . ketoconazole (NIZORAL) 2 % shampoo APPLY ONE APPLICATION AND LATHER LET REMAIN IN PLACE FOR FIVE MINUTESTHEN RINSE--REPEAT TWO TIMES A WEEK 120 mL 11  . levETIRAcetam (KEPPRA) 500 MG tablet TAKE ONE TABLET TWICE A DAY 180 tablet 3  . nystatin (MYCOSTATIN) powder APPLY TO AFFECTED AREAS ONE TO TWO TIMESDAILY 60 g 11  . SENNALAX-S 8.6-50 MG tablet TAKE ONE TABLET TWICE A DAY 100 tablet 3  . triamcinolone cream (KENALOG) 0.5 % Apply 1 application topically 2 (two)  times daily as needed. 30 g 3   No current facility-administered medications on file prior to visit.     Allergies  Allergen Reactions  . Morphine And Related     Past Medical History:  Diagnosis Date  . Agitation   . CVA (cerebral infarction)   . Frontotemporal dementia   . Hyperlipidemia   . Malnutrition ([place])   . 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 BACK SURGERY     twice on low back  . CORNEAL TRANSPLANT     needed 3 seperate times  . MELANOMA EXCISION    . MRI/MRA  04/17/11   Normal at [facility] History  Problem Relation Age of Onset  . Cancer Mother     lung  . Cancer Father     lung  . Heart disease Father   . Diabetes Sister   . Heart disease Paternal Uncle     all 5 uncles died of MI    Social History   Social History  . Marital status: Married    Spouse name: N/A  . Number of children: 2  . Years of education: N/A   Occupational History  . School Scientist, physiological     retired with illness   Social History Main Topics  . Smoking  status: Former Research scientist (life sciences)  . Smokeless tobacco: Never Used     Comment: quit pipe ~1996 (rare even before this though)  . Alcohol use No     Comment: Occ social use or daily beer till sick  . Drug use: Unknown  . Sexual activity: Not on file   Other Topics Concern  . Not on file   Social History Narrative   2nd marriage for both   1 daughter and 1 stepdaughter   Wife holds health care POA   Has DNR order   Does not want hospitalization   Never discussed tube feedings    Review of Systems Still seems to be eating okay Weight seems stable Usually swallows okay--rare choking on water if it goes down wrong Bowels highly variable--- 3-4 some days, and not at all on others. No longer giving the prune juice Chronic dry eyes---wife has tried over the counter drops without much success No ulcers or skin  breakdown Voids okay    Objective:   Physical Exam  Constitutional:  Awake but only grunts Resists exam somewhat (especially the BP)  Eyes:  Conj now look clear  Cardiovascular: Normal rate, regular rhythm, normal heart sounds and intact distal pulses.  Exam reveals no gallop.   No murmur heard. Pulmonary/Chest: Effort normal and breath sounds normal. No respiratory distress. He has no wheezes. He has no rales.  Abdominal: Soft. There is no tenderness.  Lymphadenopathy:    He has no cervical adenopathy.  Neurological:  Increased tone Little active movement but pulls arm back   Skin:  Inflamed papules on forehead and onto scalp Not much on cheeks now          Assessment & Plan:

## 2016-05-02 NOTE — Assessment & Plan Note (Addendum)
This appears exacerbated and might have been what we thought was shingles Will have her resume the medicated shampoo and try ketoconazole cream for the lesions (TAC didn't help) Can try metrogel if the [facility] doesn't work

## 2016-05-02 NOTE — Assessment & Plan Note (Signed)
Severe and end stage Some decline with more sleeping Total care

## 2016-05-02 NOTE — Assessment & Plan Note (Signed)
Bed/chair bound Lift for transfers Little active movement except withdrawl

## 2016-05-21 ENCOUNTER — Other Ambulatory Visit: Payer: Self-pay | Admitting: Internal Medicine

## 2016-07-18 ENCOUNTER — Encounter: Payer: Self-pay | Admitting: Internal Medicine

## 2016-07-18 ENCOUNTER — Ambulatory Visit: Payer: Medicare Other | Admitting: Internal Medicine

## 2016-07-18 VITALS — BP 132/86 | HR 60 | Resp 18

## 2016-07-18 DIAGNOSIS — G3109 Other frontotemporal dementia: Secondary | ICD-10-CM

## 2016-07-18 DIAGNOSIS — R532 Functional quadriplegia: Secondary | ICD-10-CM

## 2016-07-18 DIAGNOSIS — F39 Unspecified mood [affective] disorder: Secondary | ICD-10-CM | POA: Diagnosis not present

## 2016-07-18 DIAGNOSIS — F0280 Dementia in other diseases classified elsewhere without behavioral disturbance: Secondary | ICD-10-CM

## 2016-07-18 DIAGNOSIS — L219 Seborrheic dermatitis, unspecified: Secondary | ICD-10-CM

## 2016-07-18 NOTE — Assessment & Plan Note (Signed)
End stage but not significantly declining (total care already) Wife unable to do all the care due to her back and knee pain Will refer for home health and see if he is eligible for personal care help

## 2016-07-18 NOTE — Assessment & Plan Note (Signed)
Some aides find he gets agitated at times The keppra may help that Will try to avoid restarting benzos

## 2016-07-18 NOTE — Assessment & Plan Note (Signed)
Some mostly purposeless movements Lift for transfers Only bed and chair

## 2016-07-18 NOTE — Assessment & Plan Note (Signed)
Discussed topical Rx

## 2016-07-18 NOTE — Progress Notes (Signed)
Subjective:    Patient ID: Tanner Rosario, male    DOB: 1945/07/26, 71 y.o.   MRN: [id number]  HPI Home visit for follow up of severe  Wife and aide, Lelon Frohlich here  Wife is having more pain problems Chronic back problems---scoliosis and disc disease (just had to have shots in back--which have helped) Needs right TKR--but can't do this till she can walk better from her back She has fallen due to this She plans to come home right after surgery--but she can't take care of husband. Will have more help  Wife was talking to insurance--and he may be eligible for care services Needs personal care for bathing and feeding (as well as some meal prep) Changing diapers and transfers are also needed  Wife having more trouble maintaining the night coverage She can't afford help around the clock Still has to change him at least twice at night--- and it takes a long time, especially for BM Daytime aides 9AM- 8PM to give wife some respite Wife can't transfer him alone with the lift--needs another person to help her Out of bed 12-3PM and then 5-7 Just 2 meals--eating well  Some of the aides have noticed him "hard to handle" Gets tight and will resist care at   No recurrence of shingles like rash Ongoing rash along eyebrows Cortisone cream doesn't clear it up Wife feels the vaseline at least keeps it moist  Current Outpatient Prescriptions on File Prior to Visit  Medication Sig Dispense Refill  . cholecalciferol (VITAMIN D) 1000 UNITS tablet Take 1,000 Units by mouth daily.    . Infant Care Products (DERMACLOUD) CREA USE AS DIRECTED 430 g 5  . ketoconazole (NIZORAL) 2 % cream Apply 1 application topically 2 (two) times daily as needed for irritation. 60 g 11  . ketoconazole (NIZORAL) 2 % shampoo APPLY ONE APPLICATION AND LATHER LET REMAIN IN PLACE FOR FIVE MINUTESTHEN RINSE--REPEAT TWO TIMES A WEEK 120 mL 11  . levETIRAcetam (KEPPRA) 500 MG tablet TAKE ONE TABLET TWICE A DAY 180 tablet 3  .  nystatin (MYCOSTATIN) powder APPLY TO AFFECTED AREAS ONE TO TWO TIMESDAILY 60 g 11  . SENNALAX-S 8.6-50 MG tablet TAKE ONE TABLET TWICE A DAY 100 tablet 3  . triamcinolone cream (KENALOG) 0.5 % Apply 1 application topically 2 (two) times daily as needed. 30 g 3   No current facility-administered medications on file prior to visit.     Allergies  Allergen Reactions  . Morphine And Related     Past Medical History:  Diagnosis Date  . Agitation   . CVA (cerebral infarction)   . Frontotemporal dementia   . Hyperlipidemia   . Malnutrition ([place])   . 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 BACK SURGERY     twice on low back  . CORNEAL TRANSPLANT     needed 3 seperate times  . MELANOMA EXCISION    . MRI/MRA  04/17/11   Normal at [facility] History  Problem Relation Age of Onset  . Cancer Mother     lung  . Cancer Father     lung  . Heart disease Father   . Diabetes Sister   . Heart disease Paternal Uncle     all 5 uncles died of MI    Social History   Social History  . Marital status: Married  Spouse name: N/A  . Number of children: 2  . Years of education: N/A   Occupational History  . School Scientist, physiological     retired with illness   Social History Main Topics  . Smoking status: Former Research scientist (life sciences)  . Smokeless tobacco: Never Used     Comment: quit pipe ~1996 (rare even before this though)  . Alcohol use No     Comment: Occ social use or daily beer till sick  . Drug use: Unknown  . Sexual activity: Not on file   Other Topics Concern  . Not on file   Social History Narrative   2nd marriage for both   1 daughter and 1 stepdaughter   Wife holds health care POA   Has DNR order   Does not want hospitalization   Never discussed tube feedings    Review of Systems Doesn't seem to have lost any weight Sleeps at least 20 hours per day Bowels okay with  senna, prune juice and pears Voids okay --seems to have adequate stream Had foot specialist come and treat his nails--he was uncomfortable with this Broken tooth and obvious cavities--but no pain with eating No regular pain in joints or back No skin breakdown or ulcers    Objective:   Physical Exam  Cardiovascular: Normal rate, regular rhythm, normal heart sounds and intact distal pulses.   No murmur heard. Pulmonary/Chest: Effort normal and breath sounds normal. No respiratory distress. He has no wheezes. He has no rales.  Abdominal: Soft.  Musculoskeletal: He exhibits no edema.  Neurological:  Keeps eyes closed most of the time Groans when leaned forward for exam. Doesn't resist. Some speaking--not understandable Doesn't resist Increased tone  Skin: No erythema.  Psychiatric:  Not agitated now          Assessment & Plan:

## 2016-07-23 ENCOUNTER — Telehealth: Payer: Self-pay | Admitting: Internal Medicine

## 2016-07-23 NOTE — Telephone Encounter (Signed)
Spoke to [place].

## 2016-07-23 NOTE — Telephone Encounter (Signed)
Darlina Guys at [facility] called to let you know the received the referral but there was a delay. Pt is scheduled tomorrow 3/27, if this is OK. Darlina Guys needs a cb [phone].

## 2016-07-23 NOTE — Telephone Encounter (Signed)
This is okay.

## 2016-07-24 NOTE — Telephone Encounter (Signed)
Spoke to [place].

## 2016-07-24 NOTE — Telephone Encounter (Signed)
Darlina Guys nurse with [facility] left v/m that there is scheduling conflict and pt will be seen on 07/25/16. Darlina Guys request cb that this will be OK with Dr Silvio Pate.

## 2016-07-24 NOTE — Telephone Encounter (Signed)
That is okay His problems are chronic and not really acute

## 2016-09-12 ENCOUNTER — Ambulatory Visit: Payer: Medicare Other | Admitting: Internal Medicine

## 2016-09-12 ENCOUNTER — Encounter: Payer: Self-pay | Admitting: Internal Medicine

## 2016-09-12 VITALS — BP 142/86 | HR 72 | Resp 18

## 2016-09-12 DIAGNOSIS — G3109 Other frontotemporal dementia: Secondary | ICD-10-CM

## 2016-09-12 DIAGNOSIS — F39 Unspecified mood [affective] disorder: Secondary | ICD-10-CM | POA: Diagnosis not present

## 2016-09-12 DIAGNOSIS — R532 Functional quadriplegia: Secondary | ICD-10-CM

## 2016-09-12 DIAGNOSIS — F0280 Dementia in other diseases classified elsewhere without behavioral disturbance: Secondary | ICD-10-CM

## 2016-09-12 DIAGNOSIS — L219 Seborrheic dermatitis, unspecified: Secondary | ICD-10-CM | POA: Diagnosis not present

## 2016-09-12 NOTE — Assessment & Plan Note (Signed)
Severe facial rash at times May not be helped by the ketoconazole shampoo--?irritating. She will evaluate this

## 2016-09-12 NOTE — Assessment & Plan Note (Signed)
Severe Total care PPS 20% No sig decline lately Still eating okay Wife has more care for her post op period--so able to sleep more

## 2016-09-12 NOTE — Assessment & Plan Note (Signed)
No purposeful movement Total care Mostly in bed

## 2016-09-12 NOTE — Progress Notes (Signed)
Subjective:    Patient ID: Tanner Rosario, male    DOB: [birth year], 71 y.o.   MRN: [id number]  HPI Home visit for follow up of severe dementia Wife and caregiver , Tanner Rosario are here  Wife had her TKR 1 month ago Still needs the walker and having limited flexion still Had home PT --now going to [facility] (friend Tanner Rosario bringing her) She has now had some bad back pain as well She now has full time aides-- 24/7 (till May 30th)  He is about the same Had terrible rash again above his eyes--then he claws at them The creams didn't really seem to help Cold compresses did help as well as the TAC Wife changed to [facility] for shampoo--may have helped  Still up twice a day for 2 hours Total lift for transfers Incontinent Needs to be fed also  No seizures Occasionally resists care--trying to turn, clean or bathe Will occasionally grab or scratch  Does seem wheezy or stopped up at night No fast or labored breathing  Current Outpatient Prescriptions on File Prior to Visit  Medication Sig Dispense Refill  . cholecalciferol (VITAMIN D) 1000 UNITS tablet Take 1,000 Units by mouth daily.    . Infant Care Products (DERMACLOUD) CREA USE AS DIRECTED 430 g 5  . ketoconazole (NIZORAL) 2 % cream Apply 1 application topically 2 (two) times daily as needed for irritation. 60 g 11  . ketoconazole (NIZORAL) 2 % shampoo APPLY ONE APPLICATION AND LATHER LET REMAIN IN PLACE FOR FIVE MINUTESTHEN RINSE--REPEAT TWO TIMES A WEEK 120 mL 11  . levETIRAcetam (KEPPRA) 500 MG tablet TAKE ONE TABLET TWICE A DAY 180 tablet 3  . nystatin (MYCOSTATIN) powder APPLY TO AFFECTED AREAS ONE TO TWO TIMESDAILY 60 g 11  . SENNALAX-S 8.6-50 MG tablet TAKE ONE TABLET TWICE A DAY 100 tablet 3  . triamcinolone cream (KENALOG) 0.5 % Apply 1 application topically 2 (two) times daily as needed. 30 g 3   No current facility-administered medications on file prior to visit.     Allergies  Allergen Reactions  . Morphine And Related      Past Medical History:  Diagnosis Date  . Agitation   . CVA (cerebral infarction)   . Frontotemporal dementia   . Hyperlipidemia   . Malnutrition ([place])   . 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 BACK SURGERY     twice on low back  . CORNEAL TRANSPLANT     needed 3 seperate times  . MELANOMA EXCISION    . MRI/MRA  04/17/11   Normal at [facility] History  Problem Relation Age of Onset  . Cancer Mother        lung  . Cancer Father        lung  . Heart disease Father   . Diabetes Sister   . Heart disease Paternal Uncle        all 5 uncles died of MI    Social History   Social History  . Marital status: Married    Spouse name: N/A  . Number of children: 2  . Years of education: N/A   Occupational History  . School Scientist, physiological     retired with illness   Social History Main Topics  . Smoking status: Former Research scientist (life sciences)  . Smokeless tobacco: Never Used     Comment: quit pipe ~1996 (rare  even before this though)  . Alcohol use No     Comment: Occ social use or daily beer till sick  . Drug use: Unknown  . Sexual activity: Not on file   Other Topics Concern  . Not on file   Social History Narrative   2nd marriage for both   1 daughter and 1 stepdaughter   Wife holds health care POA   Has DNR order   Does not want hospitalization   Never discussed tube feedings   Review of Systems Had multiple liquidy stools over the past couple of days--better today Appetite not affected No obvious abdominal pain No obvious pain No joint swelling Seems to void without diffiuculty    Objective:   Physical Exam  Constitutional: No distress.  Neck: No thyromegaly present.  Cardiovascular: Normal rate, regular rhythm and normal heart sounds.  Exam reveals no gallop.   No murmur heard. Pulmonary/Chest: Effort normal and breath sounds normal. No respiratory distress.  He has no wheezes. He has no rales.  Abdominal: Soft.  Slight distention but no apparent tenderness  Musculoskeletal: He exhibits no edema.  Lymphadenopathy:    He has no cervical adenopathy.  Neurological:  Eyes open but doesn't engage Resists some of the exam but not with purposeful motions Increased tone  Skin:  Mild facial rash          Assessment & Plan:

## 2016-09-12 NOTE — Assessment & Plan Note (Signed)
Agitation at times but not consistent (and isolated to giving care) No meds specifically for this but the keppra seems to provide some mood stabilization

## 2016-10-05 ENCOUNTER — Other Ambulatory Visit: Payer: Self-pay | Admitting: Internal Medicine

## 2016-12-05 ENCOUNTER — Encounter: Payer: Self-pay | Admitting: Internal Medicine

## 2016-12-05 ENCOUNTER — Ambulatory Visit: Payer: Medicare Other | Admitting: Internal Medicine

## 2016-12-05 VITALS — BP 156/90 | HR 72 | Resp 20

## 2016-12-05 DIAGNOSIS — L219 Seborrheic dermatitis, unspecified: Secondary | ICD-10-CM | POA: Diagnosis not present

## 2016-12-05 DIAGNOSIS — R532 Functional quadriplegia: Secondary | ICD-10-CM

## 2016-12-05 DIAGNOSIS — F39 Unspecified mood [affective] disorder: Secondary | ICD-10-CM

## 2016-12-05 DIAGNOSIS — F0280 Dementia in other diseases classified elsewhere without behavioral disturbance: Secondary | ICD-10-CM | POA: Diagnosis not present

## 2016-12-05 DIAGNOSIS — G3109 Other frontotemporal dementia: Secondary | ICD-10-CM

## 2016-12-05 MED ORDER — TRIAMCINOLONE ACETONIDE 0.5 % EX CREA: 1.0000 "application " | TOPICAL_CREAM | Freq: Two times a day (BID) | CUTANEOUS | 3 refills | Status: DC | PRN

## 2016-12-05 NOTE — Assessment & Plan Note (Signed)
Forehead rash is very inflammatory but still seems to be seborrhea Will have her try the TAC more regluarly

## 2016-12-05 NOTE — Assessment & Plan Note (Signed)
Severe but stable Remains total care Eating okay still

## 2016-12-05 NOTE — Assessment & Plan Note (Signed)
Mild agitation at times--usually responds to leaving him alone or a gentle touch The keppra seems to help as mood stabilizer

## 2016-12-05 NOTE — Assessment & Plan Note (Signed)
Lift for transfers Needs to be fed Incontinent of B/B with no warning

## 2016-12-05 NOTE — Progress Notes (Signed)
Subjective:    Patient ID: Tanner Rosario, male    DOB: 07-04-1945, 71 y.o.   MRN: [id number]  HPI Home visit for follow up of severe dementia Wife and caregiver-Ann are here  Wife is better from her TKR Has decreased aides from 9AM-8PM--on her own at night again Still has extended time changing him, etc at night She is trying to get PT for her back--which is exacerbated by lifting/turning him  He is about the same Ongoing bad rash on forehead and by eyes Irritates into eyes Creams don't seem to help--bacitracin Wife was concerned the ketoconazole cream was making it worse  Lift to get him up They awaken him close to noon--get him in chair and give breakfast Falls asleep after meals--back to bed Up to chair again ~5PM till after dinner till at latest 8PM Most he is out of bed is 5 hours  No functional use of arms Has to be fed Will rub his eyes at times  No cough No SOB No wheezing  Agitated at times--depending on the aide If you are quiet and calm--he seems to respond to that No sig mood issues  Current Outpatient Prescriptions on File Prior to Visit  Medication Sig Dispense Refill  . cholecalciferol (VITAMIN D) 1000 UNITS tablet Take 1,000 Units by mouth daily.    . Infant Care Products (DERMACLOUD) CREA USE AS DIRECTED 430 g 5  . ketoconazole (NIZORAL) 2 % shampoo APPLY ONE APPLICATION AND LATHER LET REMAIN IN PLACE FOR FIVE MINUTESTHEN RINSE--REPEAT TWO TIMES A WEEK 120 mL 11  . levETIRAcetam (KEPPRA) 500 MG tablet TAKE 1 TABLET BY MOUTH TWICE DAILY 180 tablet 1  . nystatin (MYCOSTATIN) powder APPLY TO AFFECTED AREAS ONE TO TWO TIMESDAILY 60 g 11  . SENNALAX-S 8.6-50 MG tablet TAKE ONE TABLET TWICE A DAY 100 tablet 3   No current facility-administered medications on file prior to visit.     Allergies  Allergen Reactions  . Morphine And Related     Past Medical History:  Diagnosis Date  . Agitation   . CVA (cerebral infarction)   . Frontotemporal  dementia   . Hyperlipidemia   . Malnutrition ([place])   . 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 BACK SURGERY     twice on low back  . CORNEAL TRANSPLANT     needed 3 seperate times  . MELANOMA EXCISION    . MRI/MRA  04/17/11   Normal at [facility] History  Problem Relation Age of Onset  . Cancer Mother        lung  . Cancer Father        lung  . Heart disease Father   . Diabetes Sister   . Heart disease Paternal Uncle        all 5 uncles died of MI    Social History   Social History  . Marital status: Married    Spouse name: N/A  . Number of children: 2  . Years of education: N/A   Occupational History  . School Scientist, physiological     retired with illness   Social History Main Topics  . Smoking status: Former Research scientist (life sciences)  . Smokeless tobacco: Never Used     Comment: quit pipe ~1996 (rare even before this though)  . Alcohol use No     Comment: Occ social use or daily  beer till sick  . Drug use: Unknown  . Sexual activity: Not on file   Other Topics Concern  . Not on file   Social History Narrative   2nd marriage for both   1 daughter and 1 stepdaughter   Wife holds health care POA   Has DNR order   Does not want hospitalization   Never discussed tube feedings   Review of Systems Eats well still No weight loss Sleeps most of the time---but is up at night at times Nocturia x 3-4--wife has to get up each time. Does shake and tremor when he has urinated No skin ulcers Bowels are good on the senna    Objective:   Physical Exam  Constitutional: No distress.  Neck: No thyromegaly present.  Cardiovascular: Normal rate, regular rhythm and normal heart sounds.  Exam reveals no gallop.   No murmur heard. Faint pedal pulses  Pulmonary/Chest: Effort normal and breath sounds normal. No respiratory distress. He has no wheezes. He has no rales.  Abdominal: Soft.  There is no tenderness.  Musculoskeletal: He exhibits no edema.  Lymphadenopathy:    He has no cervical adenopathy.  Neurological:  Moves his arms around-not functional other than rubbing eyes Increased tone Resists BP measurement and some of exam  Skin:  Inflamed papular rash along forehead and down near eyes. Doesn't seem to be in eyes--but close          Assessment & Plan:

## 2017-02-13 ENCOUNTER — Ambulatory Visit: Payer: Medicare Other | Admitting: Internal Medicine

## 2017-02-13 ENCOUNTER — Encounter: Payer: Self-pay | Admitting: Internal Medicine

## 2017-02-13 VITALS — BP 142/80 | HR 60 | Resp 12

## 2017-02-13 DIAGNOSIS — F0280 Dementia in other diseases classified elsewhere without behavioral disturbance: Secondary | ICD-10-CM

## 2017-02-13 DIAGNOSIS — F39 Unspecified mood [affective] disorder: Secondary | ICD-10-CM | POA: Diagnosis not present

## 2017-02-13 DIAGNOSIS — G3109 Other frontotemporal dementia: Secondary | ICD-10-CM | POA: Diagnosis not present

## 2017-02-13 DIAGNOSIS — Z23 Encounter for immunization: Secondary | ICD-10-CM | POA: Diagnosis not present

## 2017-02-13 DIAGNOSIS — L219 Seborrheic dermatitis, unspecified: Secondary | ICD-10-CM | POA: Diagnosis not present

## 2017-02-13 DIAGNOSIS — R532 Functional quadriplegia: Secondary | ICD-10-CM

## 2017-02-13 NOTE — Assessment & Plan Note (Signed)
Does okay with the TAC cream

## 2017-02-13 NOTE — Assessment & Plan Note (Signed)
Doesn't seem depressed Some agitation at times---- keppra still seems to be working as mood stabilizer

## 2017-02-13 NOTE — Assessment & Plan Note (Signed)
No functional movement All care including feeding is done by aides or wife

## 2017-02-13 NOTE — Progress Notes (Signed)
Subjective:    Patient ID: Tanner Rosario, male    DOB: 1945/07/06, 71 y.o.   MRN: [id number]  HPI Home visit for follow up of severe dementia Wife is here and , Tanner Rosario  He is about the same They still get him up twice a day First around noon---then will eat first meal---till 3PM Back to bed for afternoon nap (on side)--seems to be his best time to move bowels Then up again in evening -- 5:30 or 6, for about 2 hours Evening meal then Lift, total care including eating No functional use of legs or arms  Still with some mood issues Hard to tell--but no clear depression Gets worked up at times   Ongoing rash on forehead Controlled with the cream  Current Outpatient Prescriptions on File Prior to Visit  Medication Sig Dispense Refill  . cholecalciferol (VITAMIN D) 1000 UNITS tablet Take 1,000 Units by mouth daily.    . Infant Care Products (DERMACLOUD) CREA USE AS DIRECTED 430 g 5  . ketoconazole (NIZORAL) 2 % shampoo APPLY ONE APPLICATION AND LATHER LET REMAIN IN PLACE FOR FIVE MINUTESTHEN RINSE--REPEAT TWO TIMES A WEEK 120 mL 11  . levETIRAcetam (KEPPRA) 500 MG tablet TAKE 1 TABLET BY MOUTH TWICE DAILY 180 tablet 1  . nystatin (MYCOSTATIN) powder APPLY TO AFFECTED AREAS ONE TO TWO TIMESDAILY 60 g 11  . SENNALAX-S 8.6-50 MG tablet TAKE ONE TABLET TWICE A DAY 100 tablet 3  . triamcinolone cream (KENALOG) 0.5 % Apply 1 application topically 2 (two) times daily as needed. 45 g 3   No current facility-administered medications on file prior to visit.     Allergies  Allergen Reactions  . Morphine And Related     Past Medical History:  Diagnosis Date  . Agitation   . CVA (cerebral infarction)   . Frontotemporal dementia   . Hyperlipidemia   . Malnutrition ([place])   . 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 BACK SURGERY     twice on low back  . CORNEAL TRANSPLANT     needed 3  seperate times  . MELANOMA EXCISION    . MRI/MRA  04/17/11   Normal at [facility] History  Problem Relation Age of Onset  . Cancer Mother        lung  . Cancer Father        lung  . Heart disease Father   . Diabetes Sister   . Heart disease Paternal Uncle        all 5 uncles died of MI    Social History   Social History  . Marital status: Married    Spouse name: N/A  . Number of children: 2  . Years of education: N/A   Occupational History  . School Scientist, physiological     retired with illness   Social History Main Topics  . Smoking status: Former Research scientist (life sciences)  . Smokeless tobacco: Never Used     Comment: quit pipe ~1996 (rare even before this though)  . Alcohol use No     Comment: Occ social use or daily beer till sick  . Drug use: Unknown  . Sexual activity: Not on file   Other Topics Concern  . Not on file   Social History Narrative   2nd marriage for both   1 daughter and 1 stepdaughter   Wife holds health  care POA   Has DNR order   Does not want hospitalization   Never discussed tube feedings   Review of Systems Doesn't move his bowels every day--despite the prune juice and softener daily Will grunt at times---?straining. Pears in prune juice can also help Continues to eat well No apparent weight loss Still sleeps most of the time No apparent problems voiding    Objective:   Physical Exam  Constitutional:  Doesn't really awaken or engage today  Neck: No thyromegaly present.  Cardiovascular: Normal rate, regular rhythm and normal heart sounds.   No murmur heard. Pulmonary/Chest: Effort normal and breath sounds normal. No respiratory distress. He has no wheezes.  Abdominal: Soft. There is no tenderness.  Lymphadenopathy:    He has no cervical adenopathy.  Skin:  Mild seborrheic rash on forehead Mycotic toenails (discussed product to debride on their own)  Psychiatric:  Doesn't interact          Assessment & Plan:

## 2017-02-13 NOTE — Assessment & Plan Note (Signed)
Severe but no progression Wife has help daily but still does his care at night--- to change him Total care Lift for transfers

## 2017-02-14 NOTE — Addendum Note (Signed)
Addended by: Pilar Grammes on: 02/14/2017 08:58 AM   Modules accepted: Orders

## 2017-02-27 ENCOUNTER — Telehealth: Payer: Self-pay | Admitting: Internal Medicine

## 2017-02-27 MED ORDER — CEPHALEXIN 500 MG PO TABS: 500.0000 mg | ORAL_TABLET | Freq: Three times a day (TID) | ORAL | Status: DC

## 2017-02-27 NOTE — Telephone Encounter (Signed)
Contacted by wife--sent me pictures of his great toe that has apparent paronychia which is rapidly worsening Discussed warm compresses and will treat with cephalexin

## 2017-02-27 NOTE — Telephone Encounter (Signed)
Copied from [place] #[id number]. Topic: Quick Communication - See Telephone Encounter >> Feb 27, 2017 11:25 AM Robina Ade, Helene Kelp D wrote: CRM for notification. See Telephone encounter for: 02/27/17. Patient wife called and said that the medication Cephalexin has not been sent to the pharmacy and she wants the office to resend it to them and to please call her when this is done so she can go for it.

## 2017-02-27 NOTE — Telephone Encounter (Signed)
CAlled the home and the caretaker said Tanner Rosario was on her way to get the antibiotic.

## 2017-04-15 ENCOUNTER — Other Ambulatory Visit: Payer: Self-pay | Admitting: Internal Medicine

## 2017-04-24 ENCOUNTER — Encounter: Payer: Self-pay | Admitting: Internal Medicine

## 2017-04-24 ENCOUNTER — Ambulatory Visit: Payer: Medicare Other | Admitting: Internal Medicine

## 2017-04-24 VITALS — BP 158/80 | HR 68 | Resp 16

## 2017-04-24 DIAGNOSIS — F39 Unspecified mood [affective] disorder: Secondary | ICD-10-CM | POA: Diagnosis not present

## 2017-04-24 DIAGNOSIS — R532 Functional quadriplegia: Secondary | ICD-10-CM

## 2017-04-24 DIAGNOSIS — F0280 Dementia in other diseases classified elsewhere without behavioral disturbance: Secondary | ICD-10-CM | POA: Diagnosis not present

## 2017-04-24 DIAGNOSIS — L219 Seborrheic dermatitis, unspecified: Secondary | ICD-10-CM | POA: Diagnosis not present

## 2017-04-24 DIAGNOSIS — G3109 Other frontotemporal dementia: Secondary | ICD-10-CM | POA: Diagnosis not present

## 2017-04-24 NOTE — Assessment & Plan Note (Signed)
Severe and total care Little or no interaction/speech Wife still tries to get by with aides 9AM-8PM and she manages the  Daughter does stay occasionally like last night--then wife can get an undisturbed night of sleep

## 2017-04-24 NOTE — Assessment & Plan Note (Signed)
Total care including feeding Lift for transfers

## 2017-04-24 NOTE — Assessment & Plan Note (Signed)
Better with the TAC Heat rash on back--discussed using dermacloud to protect the area and moisture wicking materials

## 2017-04-24 NOTE — Progress Notes (Signed)
Subjective:    Patient ID: Tanner Rosario, male    DOB: [birth year], 71 y.o.   MRN: [id number]  HPI Home visit for follow up of severe dementia Wife is here and aide Lelon Frohlich  He is doing about the same Does get skin tears easy during transfers Also gets tiny pimples on back--they will pop and bleed They wonder what to use on it---- ?dermacloud Powder hasn't helped--though 1 aide has used that  Face and head rash still there but better Cream working very well for that  They still get him up with lift twice a day Up 12-3PM for lunch. Then up again 5-7:30 for dinner  No recent agitation Wife still notes complaints from some of the aides--but they need patience If he is resistant to care, you need to wait, talk to him, etc----then he will be more compliant No obvious depression  Still needs to be fed--and all care No functional use of hands or legs  Current Outpatient Medications on File Prior to Visit  Medication Sig Dispense Refill  . Cephalexin 500 MG tablet Take 1 tablet (500 mg total) by mouth 3 (three) times daily. 15 tablet 1`  . cholecalciferol (VITAMIN D) 1000 UNITS tablet Take 1,000 Units by mouth daily.    . Infant Care Products (DERMACLOUD) CREA USE AS DIRECTED 430 g 5  . ketoconazole (NIZORAL) 2 % shampoo APPLY ONE APPLICATION AND LATHER LET REMAIN IN PLACE FOR FIVE MINUTESTHEN RINSE--REPEAT TWO TIMES A WEEK 120 mL 11  . levETIRAcetam (KEPPRA) 500 MG tablet TAKE 1 TABLET BY MOUTH TWO TIMES DAILY 180 tablet 3  . nystatin (MYCOSTATIN) powder APPLY TO AFFECTED AREAS ONE TO TWO TIMESDAILY 60 g 11  . SENNALAX-S 8.6-50 MG tablet TAKE ONE TABLET TWICE A DAY 100 tablet 3  . triamcinolone cream (KENALOG) 0.5 % Apply 1 application topically 2 (two) times daily as needed. 45 g 3   No current facility-administered medications on file prior to visit.     Allergies  Allergen Reactions  . Morphine And Related     Past Medical History:  Diagnosis Date  . Agitation   . CVA  (cerebral infarction)   . Frontotemporal dementia   . Hyperlipidemia   . Malnutrition ([place])   . 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 BACK SURGERY     twice on low back  . CORNEAL TRANSPLANT     needed 3 seperate times  . MELANOMA EXCISION    . MRI/MRA  04/17/11   Normal at [facility] History  Problem Relation Age of Onset  . Cancer Mother        lung  . Cancer Father        lung  . Heart disease Father   . Diabetes Sister   . Heart disease Paternal Uncle        all 5 uncles died of MI    Social History   Socioeconomic History  . Marital status: Married    Spouse name: Not on file  . Number of children: 2  . Years of education: Not on file  . Highest education level: Not on file  Social Needs  . Financial resource strain: Not on file  . Food insecurity - worry: Not on file  . Food insecurity - inability: Not on file  . Transportation needs - medical: Not on file  .  Transportation needs - non-medical: Not on file  Occupational History  . Occupation: Biomedical scientist    Comment: retired with illness  Tobacco Use  . Smoking status: Former Research scientist (life sciences)  . Smokeless tobacco: Never Used  . Tobacco comment: quit pipe ~1996 (rare even before this though)  Substance and Sexual Activity  . Alcohol use: No    Comment: Occ social use or daily beer till sick  . Drug use: Not on file  . Sexual activity: Not on file  Other Topics Concern  . Not on file  Social History Narrative   2nd marriage for both   1 daughter and 1 stepdaughter   Wife holds health care POA   Has DNR order   Does not want hospitalization   Never discussed tube feedings   Review of Systems Moves bowels in bed generally  Incontinent of B/B Still eats well---weight stable No regular cough or breathing problems    Objective:   Physical Exam  Constitutional: No distress.  Neck: No  thyromegaly present.  Cardiovascular: Normal rate, regular rhythm and normal heart sounds. Exam reveals no gallop.  No murmur heard. Pulmonary/Chest: Effort normal and breath sounds normal. No respiratory distress. He has no wheezes. He has no rales.  Abdominal: Soft. There is no tenderness.  Lymphadenopathy:    He has no cervical adenopathy.  Neurological:  Increased tone throughout and no active movement  Skin:  Same seborrheic rash on forehead----not bad. Multiple single tiny papules on back ---spread around. Some have burst and are open without evidence of infection          Assessment & Plan:

## 2017-04-24 NOTE — Assessment & Plan Note (Signed)
Discussed the keppra as mood stabilizer Okay to try decreasing to just once a day to see what happens

## 2017-06-26 ENCOUNTER — Encounter: Payer: Self-pay | Admitting: Internal Medicine

## 2017-06-26 ENCOUNTER — Ambulatory Visit: Payer: Medicare Other | Admitting: Internal Medicine

## 2017-06-26 VITALS — BP 124/80 | HR 72 | Resp 18

## 2017-06-26 DIAGNOSIS — G3109 Other frontotemporal dementia: Secondary | ICD-10-CM

## 2017-06-26 DIAGNOSIS — F0280 Dementia in other diseases classified elsewhere without behavioral disturbance: Secondary | ICD-10-CM | POA: Diagnosis not present

## 2017-06-26 DIAGNOSIS — Z23 Encounter for immunization: Secondary | ICD-10-CM | POA: Diagnosis not present

## 2017-06-26 DIAGNOSIS — B369 Superficial mycosis, unspecified: Secondary | ICD-10-CM

## 2017-06-26 DIAGNOSIS — L219 Seborrheic dermatitis, unspecified: Secondary | ICD-10-CM | POA: Diagnosis not present

## 2017-06-26 DIAGNOSIS — R532 Functional quadriplegia: Secondary | ICD-10-CM

## 2017-06-26 MED ORDER — NYSTATIN 100000 UNIT/GM EX POWD: CUTANEOUS | 11 refills | Status: DC

## 2017-06-26 NOTE — Assessment & Plan Note (Signed)
Severe Total care Wife plus aides handle his care at home still

## 2017-06-26 NOTE — Progress Notes (Signed)
Subjective:    Patient ID: Tanner Rosario, male    DOB: [birth year], 72 y.o.   MRN: [id number]  HPI Home visit for follow up of severe dementia Seen with wife and aide Lelon Frohlich  Having a lot of problems with his back Rash and some pimples and they come and go It is very widespread They ooze with some blood specks They have tried dermacloud He is on his back a lot of the time and sweats a lot  Facial and head rash comes and goes Not as bad lately  Still up for lunch and supper Total out of bed for 4-5 hours Still no volitional movement--total care including feeding Incontinent always  Wife did have aide overnight one Allowed her to sleep all night and sleep in!  No regular agitation for wife and Lelon Frohlich 2 other aides do note some behavioral issues still No seizures Wife did decrease keppra to daily without any apparent difference  Current Outpatient Medications on File Prior to Visit  Medication Sig Dispense Refill  . Cephalexin 500 MG tablet Take 1 tablet (500 mg total) by mouth 3 (three) times daily. 15 tablet 1`  . cholecalciferol (VITAMIN D) 1000 UNITS tablet Take 1,000 Units by mouth daily.    . Infant Care Products (DERMACLOUD) CREA USE AS DIRECTED 430 g 5  . ketoconazole (NIZORAL) 2 % shampoo APPLY ONE APPLICATION AND LATHER LET REMAIN IN PLACE FOR FIVE MINUTESTHEN RINSE--REPEAT TWO TIMES A WEEK 120 mL 11  . levETIRAcetam (KEPPRA) 500 MG tablet TAKE 1 TABLET BY MOUTH TWO TIMES DAILY 180 tablet 3  . nystatin (MYCOSTATIN) powder APPLY TO AFFECTED AREAS ONE TO TWO TIMESDAILY 60 g 11  . SENNALAX-S 8.6-50 MG tablet TAKE ONE TABLET TWICE A DAY 100 tablet 3  . triamcinolone cream (KENALOG) 0.5 % Apply 1 application topically 2 (two) times daily as needed. 45 g 3   No current facility-administered medications on file prior to visit.     Allergies  Allergen Reactions  . Morphine And Related     Past Medical History:  Diagnosis Date  . Agitation   . CVA (cerebral infarction)    . Frontotemporal dementia   . Hyperlipidemia   . Malnutrition ([place])   . 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 BACK SURGERY     twice on low back  . CORNEAL TRANSPLANT     needed 3 seperate times  . MELANOMA EXCISION    . MRI/MRA  04/17/11   Normal at [facility] History  Problem Relation Age of Onset  . Cancer Mother        lung  . Cancer Father        lung  . Heart disease Father   . Diabetes Sister   . Heart disease Paternal Uncle        all 5 uncles died of MI    Social History   Socioeconomic History  . Marital status: Married    Spouse name: Not on file  . Number of children: 2  . Years of education: Not on file  . Highest education level: Not on file  Social Needs  . Financial resource strain: Not on file  . Food insecurity - worry: Not on file  . Food insecurity - inability: Not on file  . Transportation needs - medical: Not on file  . Transportation needs -  non-medical: Not on file  Occupational History  . Occupation: Biomedical scientist    Comment: retired with illness  Tobacco Use  . Smoking status: Former Research scientist (life sciences)  . Smokeless tobacco: Never Used  . Tobacco comment: quit pipe ~1996 (rare even before this though)  Substance and Sexual Activity  . Alcohol use: No    Comment: Occ social use or daily beer till sick  . Drug use: Not on file  . Sexual activity: Not on file  Other Topics Concern  . Not on file  Social History Narrative   2nd marriage for both   1 daughter and 1 stepdaughter   Wife holds health care POA   Has DNR order   Does not want hospitalization   Never discussed tube feedings   Review of Systems Has some abnormal thckening by some left great toe Eating well still Weight seems stable Still sleeps almost all the time    Objective:   Physical Exam  Constitutional: No distress.  Neck: No thyromegaly present.    Cardiovascular: Normal rate, regular rhythm and normal heart sounds. Exam reveals no gallop.  No murmur heard. Pulmonary/Chest: Breath sounds normal. No respiratory distress. He has no wheezes. He has no rales.  Abdominal: Soft. There is no tenderness.  Musculoskeletal: He exhibits no edema.  Lymphadenopathy:    He has no cervical adenopathy.  Neurological:  Asleep Resists part of my exam --vaguely raising arms Increased tone  Skin:  Very slight distal ingrowing of lateral left great toenail  Widespread papulovesicular rash over entire back          Assessment & Plan:

## 2017-06-26 NOTE — Assessment & Plan Note (Signed)
On back--likely related to moisture Discussed cotton shirts to absorb moisture Nystatin powder bid

## 2017-06-26 NOTE — Assessment & Plan Note (Signed)
Mild forehead rash only now

## 2017-06-26 NOTE — Assessment & Plan Note (Signed)
No volitional movement Lift for transfers incontinent

## 2017-06-26 NOTE — Addendum Note (Signed)
Addended by: Pilar Grammes on: 06/26/2017 03:32 PM   Modules accepted: Orders

## 2017-07-02 ENCOUNTER — Other Ambulatory Visit: Payer: Self-pay | Admitting: Internal Medicine

## 2017-07-02 MED ORDER — KETOCONAZOLE 2 % EX SHAM: MEDICATED_SHAMPOO | CUTANEOUS | 11 refills | Status: DC

## 2017-08-17 ENCOUNTER — Other Ambulatory Visit: Payer: Self-pay | Admitting: Internal Medicine

## 2017-08-30 ENCOUNTER — Other Ambulatory Visit: Payer: Self-pay

## 2017-08-30 MED ORDER — KETOCONAZOLE 2 % EX SHAM: MEDICATED_SHAMPOO | CUTANEOUS | 11 refills | Status: AC

## 2017-09-17 ENCOUNTER — Encounter: Payer: Self-pay | Admitting: Internal Medicine

## 2017-09-18 ENCOUNTER — Encounter: Payer: Self-pay | Admitting: Internal Medicine

## 2017-09-18 ENCOUNTER — Ambulatory Visit: Payer: Medicare Other | Admitting: Internal Medicine

## 2017-09-18 VITALS — BP 122/70 | HR 66 | Resp 18

## 2017-09-18 DIAGNOSIS — R532 Functional quadriplegia: Secondary | ICD-10-CM | POA: Diagnosis not present

## 2017-09-18 DIAGNOSIS — F0280 Dementia in other diseases classified elsewhere without behavioral disturbance: Secondary | ICD-10-CM

## 2017-09-18 DIAGNOSIS — B369 Superficial mycosis, unspecified: Secondary | ICD-10-CM

## 2017-09-18 DIAGNOSIS — G3109 Other frontotemporal dementia: Secondary | ICD-10-CM

## 2017-09-18 DIAGNOSIS — F39 Unspecified mood [affective] disorder: Secondary | ICD-10-CM

## 2017-09-18 MED ORDER — TRIAMCINOLONE ACETONIDE 0.5 % EX CREA: 1.0000 "application " | TOPICAL_CREAM | Freq: Two times a day (BID) | CUTANEOUS | 3 refills | Status: AC | PRN

## 2017-09-18 MED ORDER — NYSTATIN 100000 UNIT/GM EX POWD: CUTANEOUS | 11 refills | Status: AC

## 2017-09-18 NOTE — Assessment & Plan Note (Addendum)
Mild agitation at times No clear depression The keppra still seems to be helping this--but has done okay with the keppra only once a day. Wife will try without this and see how it goes

## 2017-09-18 NOTE — Assessment & Plan Note (Signed)
Severe and end stage but no major decline Complete care, incontinent--but still eating

## 2017-09-18 NOTE — Assessment & Plan Note (Signed)
No functional use of his extremities

## 2017-09-18 NOTE — Assessment & Plan Note (Signed)
Better with the powder They are going to try an egg crate mattress to see if it reduces moisture If worsens, will use fluconazole orally

## 2017-09-18 NOTE — Progress Notes (Signed)
Subjective:    Patient ID: Tanner Rosario, male    DOB: 1945-12-28, 72 y.o.   MRN: [id number]  HPI Home visit for follow up of severe dementia. Home bound Wife and caregiver. Ann, are here  He is about the same The rash on his back is worse though This has spread and the lesions even bleed at times  Sleeping more and more Up with lift twice a day still ---for brunch and dinner Eats when up in chair Often doesn't even open his eyes when he is eating  Makes "awful loud noises" when he wets Up as much as 4 times a night to change him Did have 1 night's respite with caregiver staying overnight Had planned overnight to [place]----help didn't show up and Harrel Lemon broke  No obvious depression Resists being bathed at times--but it depends on the caregiver Otherwise, agitated mostly if wet in bed  Current Outpatient Medications on File Prior to Visit  Medication Sig Dispense Refill  . Cephalexin 500 MG tablet Take 1 tablet (500 mg total) by mouth 3 (three) times daily. 15 tablet 1`  . cholecalciferol (VITAMIN D) 1000 UNITS tablet Take 1,000 Units by mouth daily.    . Infant Care Products (DERMACLOUD) CREA USE AS DIRECTED 430 g 5  . ketoconazole (NIZORAL) 2 % shampoo APPLY ONE APPLICATION AND LATHER LET REMAIN IN PLACE FOR FIVE MINUTESTHEN RINSE--REPEAT TWO TIMES A WEEK 120 mL 11  . levETIRAcetam (KEPPRA) 500 MG tablet TAKE 1 TABLET BY MOUTH TWO TIMES DAILY (Patient taking differently: TAKE 1 TABLET BY MOUTH  DAILY) 180 tablet 3  . nystatin (MYCOSTATIN/NYSTOP) powder APPLY TO AFFECTED AREAS ONE TO TWO TIMESDAILY 60 g 11  . QC STOOL SOFTENER PLS LAXATIVE 8.6-50 MG tablet TAKE 1 TABLET BY MOUTH TWICE DAILY 100 tablet 3  . triamcinolone cream (KENALOG) 0.5 % Apply 1 application topically 2 (two) times daily as needed. 45 g 3   No current facility-administered medications on file prior to visit.     Allergies  Allergen Reactions  . Morphine And Related     Past Medical History:    Diagnosis Date  . Agitation   . CVA (cerebral infarction)   . Frontotemporal dementia   . Hyperlipidemia   . Malnutrition ([place])   . 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 BACK SURGERY     twice on low back  . CORNEAL TRANSPLANT     needed 3 seperate times  . MELANOMA EXCISION    . MRI/MRA  04/17/11   Normal at [facility] History  Problem Relation Age of Onset  . Cancer Mother        lung  . Cancer Father        lung  . Heart disease Father   . Diabetes Sister   . Heart disease Paternal Uncle        all 5 uncles died of MI    Social History   Socioeconomic History  . Marital status: Married    Spouse name: Not on file  . Number of children: 2  . Years of education: Not on file  . Highest education level: Not on file  Occupational History  . Occupation: Biomedical scientist    Comment: retired with illness  Social Needs  . Financial resource strain: Not on file  . Food insecurity:    Worry: Not  on file    Inability: Not on file  . Transportation needs:    Medical: Not on file    Non-medical: Not on file  Tobacco Use  . Smoking status: Former Research scientist (life sciences)  . Smokeless tobacco: Never Used  . Tobacco comment: quit pipe ~1996 (rare even before this though)  Substance and Sexual Activity  . Alcohol use: No    Comment: Occ social use or daily beer till sick  . Drug use: Not on file  . Sexual activity: Not on file  Lifestyle  . Physical activity:    Days per week: Not on file    Minutes per session: Not on file  . Stress: Not on file  Relationships  . Social connections:    Talks on phone: Not on file    Gets together: Not on file    Attends religious service: Not on file    Active member of club or organization: Not on file    Attends meetings of clubs or organizations: Not on file    Relationship status: Not on file  . Intimate partner violence:     Fear of current or ex partner: Not on file    Emotionally abused: Not on file    Physically abused: Not on file    Forced sexual activity: Not on file  Other Topics Concern  . Not on file  Social History Narrative   2nd marriage for both   1 daughter and 1 stepdaughter   Wife holds health care POA   Has DNR order   Does not want hospitalization   Never discussed tube feedings   Review of Systems  Still eating okay---2 meals a day Doesn't seem to have lost any weight Bowels are okay---wife tries to give him pears and prune juice. Occasional constipation though and he will strain No trouble voiding     Objective:   Physical Exam  Constitutional: No distress.  In bed  Neck: No thyromegaly present.  Cardiovascular: Normal rate, regular rhythm and normal heart sounds. Exam reveals no gallop.  No murmur heard. Respiratory: Effort normal and breath sounds normal. No respiratory distress. He has no wheezes. He has no rales.  GI: Soft. There is no tenderness.  Musculoskeletal: He exhibits no edema.  Lymphadenopathy:    He has no cervical adenopathy.  Neurological:  No response Very stiff  Skin:  Scattered red papules on back (some improvement) Same seborrheic rash on forehead--mild now           Assessment & Plan:

## 2017-12-06 ENCOUNTER — Other Ambulatory Visit: Payer: Self-pay | Admitting: Internal Medicine

## 2017-12-06 MED ORDER — LIDOCAINE VISCOUS HCL 2 % MT SOLN: OROMUCOSAL | 1 refills | Status: DC

## 2017-12-06 NOTE — Progress Notes (Signed)
lidocaine

## 2017-12-11 ENCOUNTER — Encounter: Payer: Self-pay | Admitting: Internal Medicine

## 2017-12-11 ENCOUNTER — Ambulatory Visit: Payer: Medicare Other | Admitting: Internal Medicine

## 2017-12-11 VITALS — BP 152/94 | HR 78 | Resp 20

## 2017-12-11 DIAGNOSIS — F39 Unspecified mood [affective] disorder: Secondary | ICD-10-CM

## 2017-12-11 DIAGNOSIS — L219 Seborrheic dermatitis, unspecified: Secondary | ICD-10-CM

## 2017-12-11 DIAGNOSIS — R1310 Dysphagia, unspecified: Secondary | ICD-10-CM | POA: Insufficient documentation

## 2017-12-11 DIAGNOSIS — K5900 Constipation, unspecified: Secondary | ICD-10-CM

## 2017-12-11 DIAGNOSIS — R1311 Dysphagia, oral phase: Secondary | ICD-10-CM

## 2017-12-11 DIAGNOSIS — R532 Functional quadriplegia: Secondary | ICD-10-CM

## 2017-12-11 DIAGNOSIS — F0280 Dementia in other diseases classified elsewhere without behavioral disturbance: Secondary | ICD-10-CM

## 2017-12-11 DIAGNOSIS — G3109 Other frontotemporal dementia: Secondary | ICD-10-CM

## 2017-12-11 MED ORDER — SENNOSIDES-DOCUSATE SODIUM 8.6-50 MG PO TABS: 1.0000 | ORAL_TABLET | Freq: Two times a day (BID) | ORAL | 11 refills | Status: AC

## 2017-12-11 MED ORDER — AMOXICILLIN-POT CLAVULANATE 600-42.9 MG/5ML PO SUSR: 900.0000 mg | Freq: Two times a day (BID) | ORAL | 0 refills | Status: DC

## 2017-12-11 NOTE — Assessment & Plan Note (Signed)
Advised adding miralax to the senna s

## 2017-12-11 NOTE — Assessment & Plan Note (Signed)
Lift for transfers Total care including feeding

## 2017-12-11 NOTE — Assessment & Plan Note (Signed)
Quiet with the ketoconazole

## 2017-12-11 NOTE — Assessment & Plan Note (Signed)
Not quite as agitated Is off the keppra

## 2017-12-11 NOTE — Progress Notes (Signed)
Subjective:    Patient ID: Tanner Rosario, male    DOB: 1946-03-23, 72 y.o.   MRN: [id number]  HPI Home visit for review of severe dementia and bed bound status Wife and care giver, Lelon Frohlich are here as usual Daughter Jenny Reichmann is also here  Has had a striking change Stopped chewing and eating well Did have visible oral ulcer---they started the lidocaine gel I prescribed Now will only eat pureed food---won't chew in the back of his mouth like before May have been having some pain when brushing teeth in the back of his mouth Left jaw may be swollen---Ann and [name] have noted Low grade fever a couple of weeks ago Known bad teeth--but it has not bothered him in the past Takes at least an hour to feed him  Ongoing constipation Ran out of the senna Still gets prune juice Tried some magnesium citrate---had terrible blow outs (kept wife up all night) Still sometimes needs manual disimpaction They encourage lots of fluids  Wife still does his care all night Often up to change him---he gets distressed when   Lift to get out of bed Up around 11:30 to eat--then back to bed by 3PM Up again for supper ~5-6PM Only eats twice a day  Still makes "wild sounds"when voiding Not much agitation at this point though  Current Outpatient Medications on File Prior to Visit  Medication Sig Dispense Refill  . cholecalciferol (VITAMIN D) 1000 UNITS tablet Take 1,000 Units by mouth daily.    . Infant Care Products (DERMACLOUD) CREA USE AS DIRECTED 430 g 5  . ketoconazole (NIZORAL) 2 % shampoo APPLY ONE APPLICATION AND LATHER LET REMAIN IN PLACE FOR FIVE MINUTESTHEN RINSE--REPEAT TWO TIMES A WEEK 120 mL 11  . lidocaine (XYLOCAINE) 2 % solution Apply every 2 hours as needed to mouth sores 100 mL 1  . nystatin (MYCOSTATIN/NYSTOP) powder APPLY TO AFFECTED AREAS ONE TO TWO TIMESDAILY 120 g 11  . QC STOOL SOFTENER PLS LAXATIVE 8.6-50 MG tablet TAKE 1 TABLET BY MOUTH TWICE DAILY 100 tablet 3  . triamcinolone  cream (KENALOG) 0.5 % Apply 1 application topically 2 (two) times daily as needed. 45 g 3   No current facility-administered medications on file prior to visit.     Allergies  Allergen Reactions  . Morphine And Related     Past Medical History:  Diagnosis Date  . Agitation   . CVA (cerebral infarction)   . Frontotemporal dementia   . Hyperlipidemia   . Malnutrition ([place])   . 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 BACK SURGERY     twice on low back  . CORNEAL TRANSPLANT     needed 3 seperate times  . MELANOMA EXCISION    . MRI/MRA  04/17/11   Normal at [facility] History  Problem Relation Age of Onset  . Cancer Mother        lung  . Cancer Father        lung  . Heart disease Father   . Diabetes Sister   . Heart disease Paternal Uncle        all 5 uncles died of MI    Social History   Socioeconomic History  . Marital status: Married    Spouse name: Not on file  . Number of children: 2  . Years of education: Not on file  .  Highest education level: Not on file  Occupational History  . Occupation: Biomedical scientist    Comment: retired with illness  Social Needs  . Financial resource strain: Not on file  . Food insecurity:    Worry: Not on file    Inability: Not on file  . Transportation needs:    Medical: Not on file    Non-medical: Not on file  Tobacco Use  . Smoking status: Former Research scientist (life sciences)  . Smokeless tobacco: Never Used  . Tobacco comment: quit pipe ~1996 (rare even before this though)  Substance and Sexual Activity  . Alcohol use: No    Comment: Occ social use or daily beer till sick  . Drug use: Not on file  . Sexual activity: Not on file  Lifestyle  . Physical activity:    Days per week: Not on file    Minutes per session: Not on file  . Stress: Not on file  Relationships  . Social connections:    Talks on phone: Not on file    Gets  together: Not on file    Attends religious service: Not on file    Active member of club or organization: Not on file    Attends meetings of clubs or organizations: Not on file    Relationship status: Not on file  . Intimate partner violence:    Fear of current or ex partner: Not on file    Emotionally abused: Not on file    Physically abused: Not on file    Forced sexual activity: Not on file  Other Topics Concern  . Not on file  Social History Narrative   2nd marriage for both   1 daughter and 1 stepdaughter   Wife holds health care POA   Has DNR order   Does not want hospitalization   Never discussed tube feedings   Review of Systems Doesn't seem to have lost weight Still sleeps most of the time---more now than before No apparent joint pain Head and back rashes remain under control with treatment (powder on back, cream for seborrhea) Sent picture of rash near eyes to eye doctor--Rx for neosporin and antibiotic around outside of eye (picture indicates blepharitis/conjunctivitis)    Objective:   Physical Exam  Constitutional: No distress.  In chair Eyes closed Resists exam somewhat  HENT:  No jaw swelling Does have protrusions by roots of lower left molars--but not really inflamed  Neck: No thyromegaly present.  Cardiovascular: Normal rate, regular rhythm and normal heart sounds. Exam reveals no gallop.  No murmur heard. Respiratory: Effort normal and breath sounds normal. No respiratory distress. He has no wheezes. He has no rales.  GI: Soft. There is no tenderness.  Musculoskeletal: He exhibits no edema.  Lymphadenopathy:    He has no cervical adenopathy.  Neurological:  Increased tone No speech or response           Assessment & Plan:

## 2017-12-11 NOTE — Assessment & Plan Note (Signed)
Severe and end stage If doesn't eat better after augmentin treatment---would ask hospice back in PPS-20%

## 2017-12-11 NOTE — Assessment & Plan Note (Addendum)
Likely has dental infection but could be progression of his disease Will try augmentin Wife and aide wonder about [facility] to pull all teeth---I don't think this is practical Has the lidocaine as well Wife is going to try adding boost for better nutrition

## 2018-02-12 ENCOUNTER — Encounter: Payer: Self-pay | Admitting: Internal Medicine

## 2018-02-12 ENCOUNTER — Ambulatory Visit: Payer: Medicare Other | Admitting: Internal Medicine

## 2018-02-12 ENCOUNTER — Telehealth: Payer: Self-pay

## 2018-02-12 VITALS — BP 156/96 | HR 66 | Resp 20

## 2018-02-12 DIAGNOSIS — R532 Functional quadriplegia: Secondary | ICD-10-CM | POA: Diagnosis not present

## 2018-02-12 DIAGNOSIS — F0280 Dementia in other diseases classified elsewhere without behavioral disturbance: Secondary | ICD-10-CM

## 2018-02-12 DIAGNOSIS — Z23 Encounter for immunization: Secondary | ICD-10-CM

## 2018-02-12 DIAGNOSIS — K5900 Constipation, unspecified: Secondary | ICD-10-CM

## 2018-02-12 DIAGNOSIS — L219 Seborrheic dermatitis, unspecified: Secondary | ICD-10-CM | POA: Diagnosis not present

## 2018-02-12 DIAGNOSIS — G3109 Other frontotemporal dementia: Secondary | ICD-10-CM | POA: Diagnosis not present

## 2018-02-12 DIAGNOSIS — F39 Unspecified mood [affective] disorder: Secondary | ICD-10-CM

## 2018-02-12 NOTE — Addendum Note (Signed)
Addended by: Pilar Grammes on: 02/12/2018 03:51 PM   Modules accepted: Orders

## 2018-02-12 NOTE — Assessment & Plan Note (Signed)
Total care Marked hypertonicity is worse Doesn't use arms or legs Lift for transfer Total care

## 2018-02-12 NOTE — Telephone Encounter (Signed)
Copied from [place] [id number]. Topic: General - Other >> Feb 12, 2018  3:54 PM Margot Ables wrote: Got VO from Dr. Silvio Pate for hospice for pt - needing last 3 OV notes, MAR if available, demographics, family emergency contacts, insurance information. Fax # [phone]

## 2018-02-12 NOTE — Assessment & Plan Note (Signed)
Severe and end stage Clearly has lost weight---not eating (only pureed and not that much) Referral made to hospice today Total care

## 2018-02-12 NOTE — Assessment & Plan Note (Signed)
Okay with senna and prune juice

## 2018-02-12 NOTE — Progress Notes (Signed)
Subjective:    Patient ID: Tanner Rosario, male    DOB: 08-Jul-1945, 72 y.o.   MRN: [id number]  HPI Home visit for follow up of severe dementia in this bed bound patient Wife and aide Lelon Frohlich are here as usual  He is still not eating the same Has forgotten how to use molars and chew Wife has been pureeing food for the past 2 months Takes a long time to feed him---he sleeps so much Will pocket food at times Adding protein drink as well Clearly has lost weight now---though still big and hard to move  Did have home visit from [facility] home NP She did check his mouth---no obvious problems  Gets redness on scalp after shampooing Also has red area on left arm---looks like a bite May be closing up some Using benedryl cream  Still getting him up in lift twice a day Up in chair for about 2 hours each time  No longer agitated at all Wife still alone with him at night---she usually changes him at least twice every night Not as agitated or noisy when urinating anymore  Current Outpatient Medications on File Prior to Visit  Medication Sig Dispense Refill  . cholecalciferol (VITAMIN D) 1000 UNITS tablet Take 1,000 Units by mouth daily.    . Infant Care Products (DERMACLOUD) CREA USE AS DIRECTED 430 g 5  . ketoconazole (NIZORAL) 2 % shampoo APPLY ONE APPLICATION AND LATHER LET REMAIN IN PLACE FOR FIVE MINUTESTHEN RINSE--REPEAT TWO TIMES A WEEK 120 mL 11  . nystatin (MYCOSTATIN/NYSTOP) powder APPLY TO AFFECTED AREAS ONE TO TWO TIMESDAILY 120 g 11  . senna-docusate (QC STOOL SOFTENER PLS LAXATIVE) 8.6-50 MG tablet Take 1 tablet by mouth 2 (two) times daily. 100 tablet 11  . triamcinolone cream (KENALOG) 0.5 % Apply 1 application topically 2 (two) times daily as needed. 45 g 3   No current facility-administered medications on file prior to visit.     Allergies  Allergen Reactions  . Morphine And Related     Past Medical History:  Diagnosis Date  . Agitation   . CVA (cerebral infarction)     . Frontotemporal dementia   . Hyperlipidemia   . Malnutrition ([place])   . 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 BACK SURGERY     twice on low back  . CORNEAL TRANSPLANT     needed 3 seperate times  . MELANOMA EXCISION    . MRI/MRA  04/17/11   Normal at [facility] History  Problem Relation Age of Onset  . Cancer Mother        lung  . Cancer Father        lung  . Heart disease Father   . Diabetes Sister   . Heart disease Paternal Uncle        all 5 uncles died of MI    Social History   Socioeconomic History  . Marital status: Married    Spouse name: Not on file  . Number of children: 2  . Years of education: Not on file  . Highest education level: Not on file  Occupational History  . Occupation: Biomedical scientist    Comment: retired with illness  Social Needs  . Financial resource strain: Not on file  . Food insecurity:    Worry: Not on file    Inability: Not on file  .  Transportation needs:    Medical: Not on file    Non-medical: Not on file  Tobacco Use  . Smoking status: Former Research scientist (life sciences)  . Smokeless tobacco: Never Used  . Tobacco comment: quit pipe ~1996 (rare even before this though)  Substance and Sexual Activity  . Alcohol use: No    Comment: Occ social use or daily beer till sick  . Drug use: Not on file  . Sexual activity: Not on file  Lifestyle  . Physical activity:    Days per week: Not on file    Minutes per session: Not on file  . Stress: Not on file  Relationships  . Social connections:    Talks on phone: Not on file    Gets together: Not on file    Attends religious service: Not on file    Active member of club or organization: Not on file    Attends meetings of clubs or organizations: Not on file    Relationship status: Not on file  . Intimate partner violence:    Fear of current or ex partner: Not on file    Emotionally abused:  Not on file    Physically abused: Not on file    Forced sexual activity: Not on file  Other Topics Concern  . Not on file  Social History Narrative   2nd marriage for both   1 daughter and 1 stepdaughter   Wife holds health care POA   Has DNR order   Does not want hospitalization   Never discussed tube feedings   Review of Systems No fever No seizures Does cough at times--- associated with eating. No apparent aspiration Did vomit once after eating No obvious SOB Bowels okay with the senna. Loose stools in past 2 days ---wife holds the prune juice then    Objective:   Physical Exam  Constitutional:  Has clearly lost weight--in face, legs and trunk  Neck: No thyromegaly present.  Cardiovascular: Normal rate, regular rhythm and normal heart sounds. Exam reveals no gallop.  No murmur heard. Respiratory: Effort normal and breath sounds normal. No respiratory distress. He has no wheezes. He has no rales.  GI: Soft. There is no tenderness.  Lymphadenopathy:    He has no cervical adenopathy.  Neurological:  Doesn't really respond---grunts only Marked increased tone (hard to even bend arm to do BP--result not reliable)  Skin:  Seborrhea on forehead/vertex           Assessment & Plan:

## 2018-02-12 NOTE — Assessment & Plan Note (Signed)
No longer agitated as his dementia has worsened

## 2018-02-12 NOTE — Assessment & Plan Note (Signed)
Ketoconazole shampoo and TAC

## 2018-03-21 ENCOUNTER — Telehealth: Payer: Self-pay | Admitting: Internal Medicine

## 2018-03-21 ENCOUNTER — Other Ambulatory Visit: Payer: Self-pay | Admitting: Internal Medicine

## 2018-03-21 MED ORDER — MORPHINE SULFATE (CONCENTRATE) 20 MG/ML PO SOLN: 10.0000 mg | ORAL | 0 refills | Status: DC | PRN

## 2018-03-21 MED ORDER — MORPHINE SULFATE (CONCENTRATE) 20 MG/ML PO SOLN: 10.0000 mg | ORAL | 0 refills | Status: AC | PRN

## 2018-03-21 NOTE — Telephone Encounter (Signed)
Phone call from Tanner Rosario from hospice Going out now due to fever and change in status Worsening dysphagia--not sure if aspiration pneumonia or transitioning  Discussed that I have recommended to wife in past not to use antibiotics --she will discuss and I will try to call her later  Rx for roxanol sent

## 2018-03-30 DEATH — deceased

## 2018-12-12 ENCOUNTER — Ambulatory Visit: Payer: Medicare Other | Admitting: Internal Medicine
